# Patient Record
Sex: Female | Born: 1935
Health system: Southern US, Community
[De-identification: ages and names within clinical notes are randomized; demographics above are authoritative.]

## PROBLEM LIST (undated history)

## (undated) DIAGNOSIS — E785 Hyperlipidemia, unspecified: Secondary | ICD-10-CM

## (undated) DIAGNOSIS — N200 Calculus of kidney: Secondary | ICD-10-CM

## (undated) DIAGNOSIS — R7309 Other abnormal glucose: Secondary | ICD-10-CM

## (undated) DIAGNOSIS — E669 Obesity, unspecified: Secondary | ICD-10-CM

## (undated) DIAGNOSIS — M1990 Unspecified osteoarthritis, unspecified site: Secondary | ICD-10-CM

## (undated) DIAGNOSIS — I10 Essential (primary) hypertension: Secondary | ICD-10-CM

## (undated) HISTORY — DX: Other abnormal glucose: R73.09

## (undated) HISTORY — DX: Essential (primary) hypertension: I10

## (undated) HISTORY — DX: Obesity, unspecified: E66.9

## (undated) HISTORY — DX: Unspecified osteoarthritis, unspecified site: M19.90

## (undated) HISTORY — DX: Hyperlipidemia, unspecified: E78.5

## (undated) HISTORY — DX: Calculus of kidney: N20.0

## (undated) HISTORY — PX: TONSILLECTOMY: SHX5217

---

## 2001-08-15 ENCOUNTER — Ambulatory Visit (HOSPITAL_COMMUNITY): Admission: RE | Admit: 2001-08-15 | Discharge: 2001-08-15 | Payer: Self-pay | Admitting: Internal Medicine

## 2001-08-15 ENCOUNTER — Encounter: Payer: Self-pay | Admitting: Internal Medicine

## 2002-08-15 ENCOUNTER — Ambulatory Visit (HOSPITAL_COMMUNITY): Admission: RE | Admit: 2002-08-15 | Discharge: 2002-08-15 | Payer: Self-pay | Admitting: Internal Medicine

## 2002-08-15 ENCOUNTER — Encounter: Payer: Self-pay | Admitting: Internal Medicine

## 2003-08-17 ENCOUNTER — Ambulatory Visit (HOSPITAL_COMMUNITY): Admission: RE | Admit: 2003-08-17 | Discharge: 2003-08-17 | Payer: Self-pay | Admitting: Internal Medicine

## 2004-03-19 ENCOUNTER — Ambulatory Visit (HOSPITAL_COMMUNITY): Admission: RE | Admit: 2004-03-19 | Discharge: 2004-03-19 | Payer: Self-pay | Admitting: Internal Medicine

## 2004-03-25 ENCOUNTER — Ambulatory Visit (HOSPITAL_COMMUNITY): Admission: RE | Admit: 2004-03-25 | Discharge: 2004-03-25 | Payer: Self-pay | Admitting: Urology

## 2004-04-02 ENCOUNTER — Ambulatory Visit (HOSPITAL_COMMUNITY): Admission: RE | Admit: 2004-04-02 | Discharge: 2004-04-02 | Payer: Self-pay | Admitting: Urology

## 2004-05-05 ENCOUNTER — Ambulatory Visit (HOSPITAL_COMMUNITY): Admission: RE | Admit: 2004-05-05 | Discharge: 2004-05-05 | Payer: Self-pay | Admitting: Urology

## 2004-06-02 ENCOUNTER — Ambulatory Visit (HOSPITAL_COMMUNITY): Admission: RE | Admit: 2004-06-02 | Discharge: 2004-06-02 | Payer: Self-pay | Admitting: Urology

## 2004-06-18 ENCOUNTER — Ambulatory Visit (HOSPITAL_COMMUNITY): Admission: RE | Admit: 2004-06-18 | Discharge: 2004-06-18 | Payer: Self-pay | Admitting: Urology

## 2004-06-20 ENCOUNTER — Ambulatory Visit (HOSPITAL_COMMUNITY): Admission: RE | Admit: 2004-06-20 | Discharge: 2004-06-20 | Payer: Self-pay | Admitting: Urology

## 2004-07-21 ENCOUNTER — Ambulatory Visit (HOSPITAL_COMMUNITY): Admission: RE | Admit: 2004-07-21 | Discharge: 2004-07-21 | Payer: Self-pay | Admitting: Urology

## 2004-09-02 ENCOUNTER — Ambulatory Visit (HOSPITAL_COMMUNITY): Admission: RE | Admit: 2004-09-02 | Discharge: 2004-09-02 | Payer: Self-pay | Admitting: Internal Medicine

## 2004-09-22 ENCOUNTER — Ambulatory Visit (HOSPITAL_COMMUNITY): Admission: RE | Admit: 2004-09-22 | Discharge: 2004-09-22 | Payer: Self-pay | Admitting: Urology

## 2004-09-30 ENCOUNTER — Ambulatory Visit (HOSPITAL_COMMUNITY): Admission: RE | Admit: 2004-09-30 | Discharge: 2004-09-30 | Payer: Self-pay | Admitting: Urology

## 2004-10-19 LAB — CONVERTED CEMR LAB: Pap Smear: NORMAL

## 2004-10-29 ENCOUNTER — Ambulatory Visit (HOSPITAL_COMMUNITY): Admission: RE | Admit: 2004-10-29 | Discharge: 2004-10-29 | Payer: Self-pay | Admitting: Urology

## 2004-12-09 ENCOUNTER — Ambulatory Visit (HOSPITAL_COMMUNITY): Admission: RE | Admit: 2004-12-09 | Discharge: 2004-12-09 | Payer: Self-pay | Admitting: Urology

## 2005-03-04 ENCOUNTER — Ambulatory Visit (HOSPITAL_COMMUNITY): Admission: RE | Admit: 2005-03-04 | Discharge: 2005-03-04 | Payer: Self-pay | Admitting: Urology

## 2005-05-26 ENCOUNTER — Encounter (INDEPENDENT_AMBULATORY_CARE_PROVIDER_SITE_OTHER): Payer: Self-pay | Admitting: General Surgery

## 2005-05-26 ENCOUNTER — Ambulatory Visit (HOSPITAL_COMMUNITY): Admission: RE | Admit: 2005-05-26 | Discharge: 2005-05-26 | Payer: Self-pay | Admitting: General Surgery

## 2005-07-12 IMAGING — CR DG ABDOMEN 1V
1 series · 1 of 1 positions shown · non-contrast
Comparison: none

CLINICAL DATA: Right-sided renal calculus.  Status-post lithotripsy. 
 FRONTAL VIEW ABDOMEN 
 Comparing 06/18/04.

[view not recorded]
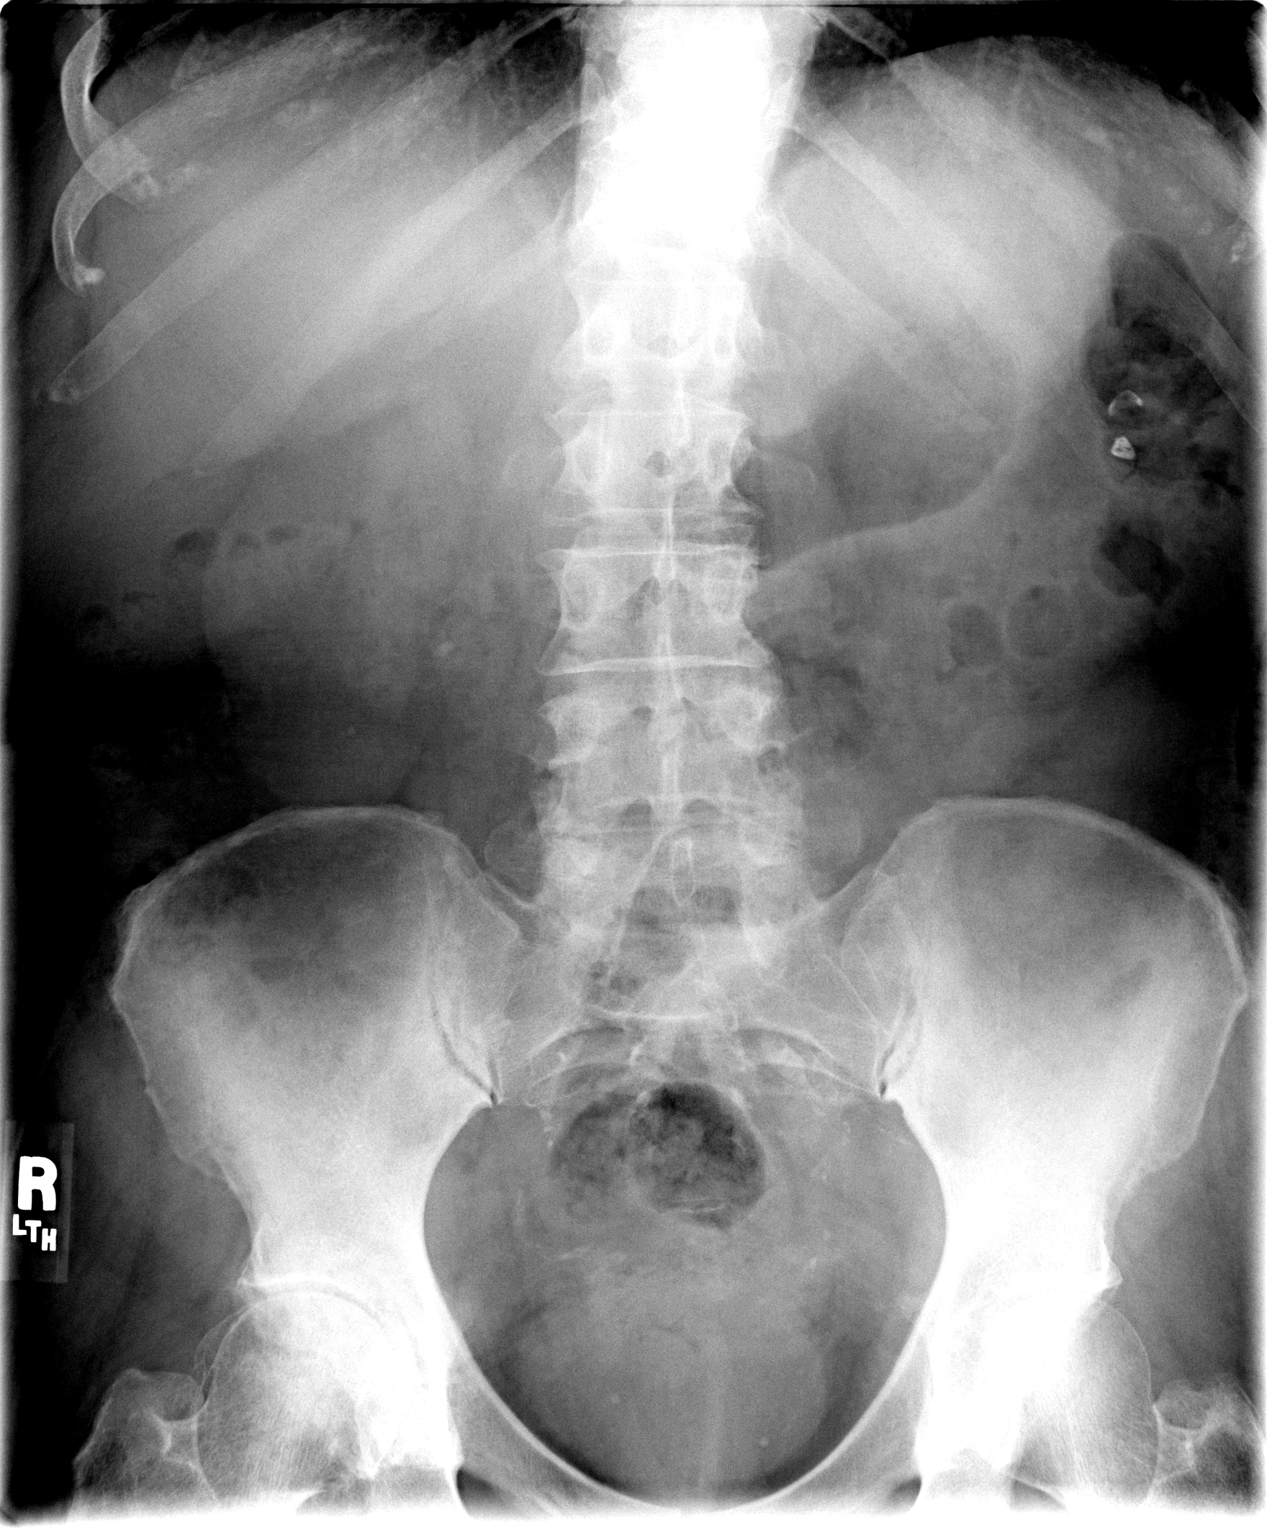

[1 of 1 positions shown; findings below may reference images not displayed]

FINDINGS: Currently we demonstrate a 6.2 x 3.4 mm calculus projecting over the expected location of the right collecting system.  There is a 2 mm calculus projecting over the right kidney lower pole.  Linear calcification in the right anatomic pelvis is stable from the prior exam and thus is felt to represent vascular calcification rather than distal ureteral calculi.  Pelvic phleboliths are also noted.  There is advanced degenerative arthropathy of the right hip.
 IMPRESSION
 1.  Right collecting system calculus and right lower pole caliceal calculus. 
 2  Advanced degenerative arthropathy of the right hip.

## 2005-09-15 ENCOUNTER — Ambulatory Visit (HOSPITAL_COMMUNITY): Admission: RE | Admit: 2005-09-15 | Discharge: 2005-09-15 | Payer: Self-pay | Admitting: Family Medicine

## 2006-09-23 ENCOUNTER — Ambulatory Visit (HOSPITAL_COMMUNITY): Admission: RE | Admit: 2006-09-23 | Discharge: 2006-09-23 | Payer: Self-pay | Admitting: Family Medicine

## 2006-09-30 ENCOUNTER — Encounter: Admission: RE | Admit: 2006-09-30 | Discharge: 2006-09-30 | Payer: Self-pay | Admitting: Family Medicine

## 2007-10-14 ENCOUNTER — Encounter: Admission: RE | Admit: 2007-10-14 | Discharge: 2007-10-14 | Payer: Self-pay | Admitting: Family Medicine

## 2008-04-11 ENCOUNTER — Ambulatory Visit (HOSPITAL_COMMUNITY): Admission: RE | Admit: 2008-04-11 | Discharge: 2008-04-11 | Payer: Self-pay | Admitting: Urology

## 2008-04-18 ENCOUNTER — Ambulatory Visit (HOSPITAL_COMMUNITY): Admission: RE | Admit: 2008-04-18 | Discharge: 2008-04-18 | Payer: Self-pay | Admitting: Urology

## 2008-04-23 ENCOUNTER — Ambulatory Visit (HOSPITAL_COMMUNITY): Admission: RE | Admit: 2008-04-23 | Discharge: 2008-04-23 | Payer: Self-pay | Admitting: Urology

## 2008-10-15 ENCOUNTER — Encounter: Payer: Self-pay | Admitting: Family Medicine

## 2008-10-15 ENCOUNTER — Encounter: Admission: RE | Admit: 2008-10-15 | Discharge: 2008-10-15 | Payer: Self-pay | Admitting: Family Medicine

## 2008-11-29 ENCOUNTER — Encounter: Payer: Self-pay | Admitting: Family Medicine

## 2008-12-05 ENCOUNTER — Ambulatory Visit (HOSPITAL_COMMUNITY): Admission: RE | Admit: 2008-12-05 | Discharge: 2008-12-05 | Payer: Self-pay | Admitting: Urology

## 2009-03-08 DIAGNOSIS — E785 Hyperlipidemia, unspecified: Secondary | ICD-10-CM

## 2009-03-08 DIAGNOSIS — I10 Essential (primary) hypertension: Secondary | ICD-10-CM

## 2009-03-08 HISTORY — DX: Essential (primary) hypertension: I10

## 2009-03-08 HISTORY — DX: Hyperlipidemia, unspecified: E78.5

## 2009-05-10 IMAGING — CR DG ABDOMEN 1V
1 series · 1 of 1 positions shown · non-contrast
Comparison: Correlation made with CT abdomen and pelvis 04/11/2008

CLINICAL DATA: Right renal calculus, pre lithotripsy

ABDOMEN - 1 VIEW

[view not recorded]
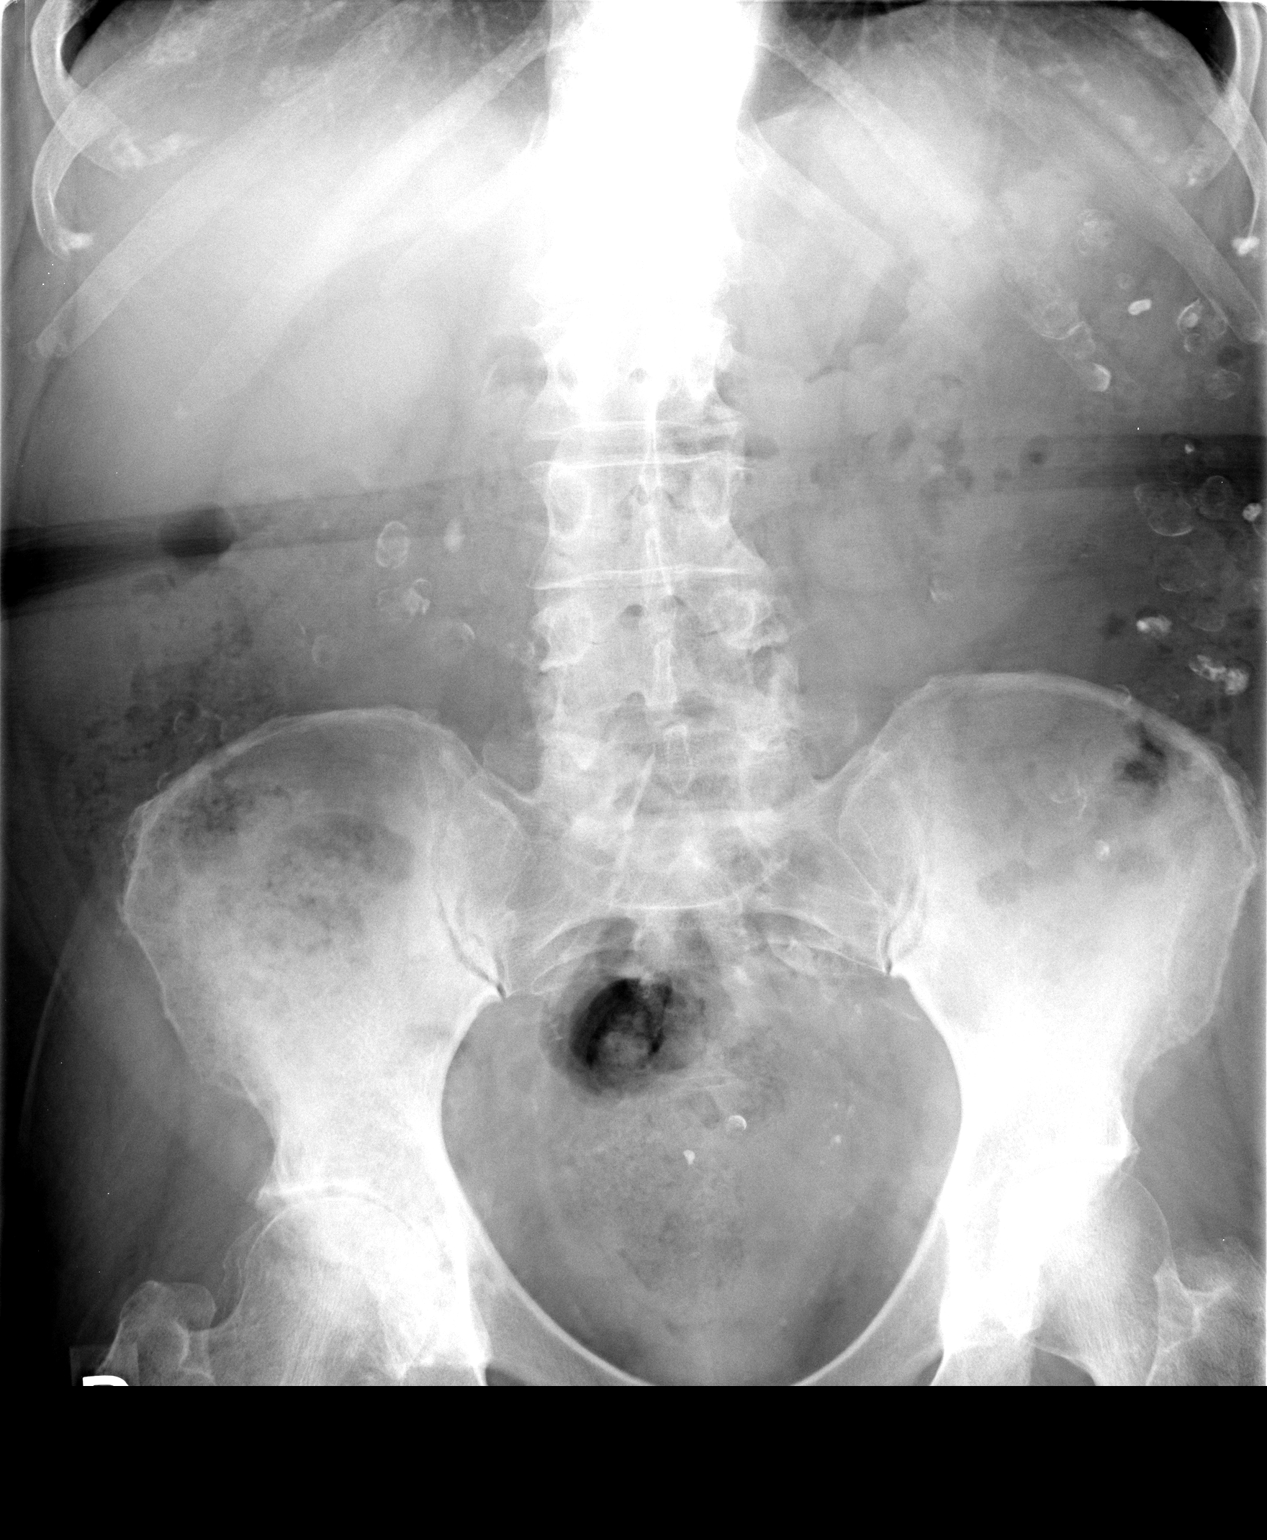

[1 of 1 positions shown; findings below may reference images not displayed]

FINDINGS: Calculus at right renal pelvis, 9 x 6 mm.
Tiny calcification in mid right kidney, 2 mm diameter.
Question tiny left renal calculi at mid and lower pole regions,
largest 4 mm down mid kidney.
Bowel gas pattern normal.
High attenuation residual contrast within multiple colonic
diverticula of transverse and descending colon.
Facet degenerative changes of lower lumbar spine.
Osteoarthritic changes right hip.
Bony demineralization.
IMPRESSION: Bilateral renal calculi with 9 x 6 mm diameter calculus at right
renal pelvis.
Colonic diverticulosis.

## 2009-05-23 ENCOUNTER — Ambulatory Visit: Payer: Self-pay | Admitting: Family Medicine

## 2009-05-23 LAB — CONVERTED CEMR LAB
AST: 22 units/L (ref 0–37)
Albumin: 3.9 g/dL (ref 3.5–5.2)
Alkaline Phosphatase: 91 units/L (ref 39–117)
CO2: 30 meq/L (ref 19–32)
Calcium: 10.3 mg/dL (ref 8.4–10.5)
Creatinine, Ser: 0.8 mg/dL (ref 0.4–1.2)
Glucose, Bld: 131 mg/dL — ABNORMAL HIGH (ref 70–99)
Total Bilirubin: 0.9 mg/dL (ref 0.3–1.2)
Total CHOL/HDL Ratio: 5
Triglycerides: 86 mg/dL (ref 0.0–149.0)

## 2009-07-04 ENCOUNTER — Telehealth: Payer: Self-pay | Admitting: Family Medicine

## 2009-08-22 ENCOUNTER — Ambulatory Visit: Payer: Self-pay | Admitting: Family Medicine

## 2009-08-22 ENCOUNTER — Telehealth: Payer: Self-pay | Admitting: Family Medicine

## 2009-08-22 LAB — CONVERTED CEMR LAB
AST: 25 units/L (ref 0–37)
Alkaline Phosphatase: 87 units/L (ref 39–117)
Bilirubin, Direct: 0 mg/dL (ref 0.0–0.3)
CO2: 30 meq/L (ref 19–32)
Calcium: 9.6 mg/dL (ref 8.4–10.5)
Creatinine, Ser: 0.9 mg/dL (ref 0.4–1.2)
Glucose, Bld: 119 mg/dL — ABNORMAL HIGH (ref 70–99)
Total Bilirubin: 0.9 mg/dL (ref 0.3–1.2)
Total CHOL/HDL Ratio: 4

## 2009-08-23 ENCOUNTER — Ambulatory Visit: Payer: Self-pay | Admitting: Family Medicine

## 2009-08-23 DIAGNOSIS — R7309 Other abnormal glucose: Secondary | ICD-10-CM

## 2009-08-23 HISTORY — DX: Other abnormal glucose: R73.09

## 2009-09-02 ENCOUNTER — Telehealth: Payer: Self-pay | Admitting: Family Medicine

## 2009-10-17 ENCOUNTER — Encounter: Admission: RE | Admit: 2009-10-17 | Discharge: 2009-10-17 | Payer: Self-pay | Admitting: Family Medicine

## 2009-11-21 ENCOUNTER — Ambulatory Visit: Payer: Self-pay | Admitting: Family Medicine

## 2009-12-27 IMAGING — CR DG ABDOMEN 1V
1 series · 1 of 1 positions shown · non-contrast
Comparison: 04/23/1978.

CLINICAL DATA: Bilateral renal calculi.

ABDOMEN - 1 VIEW

[view not recorded]
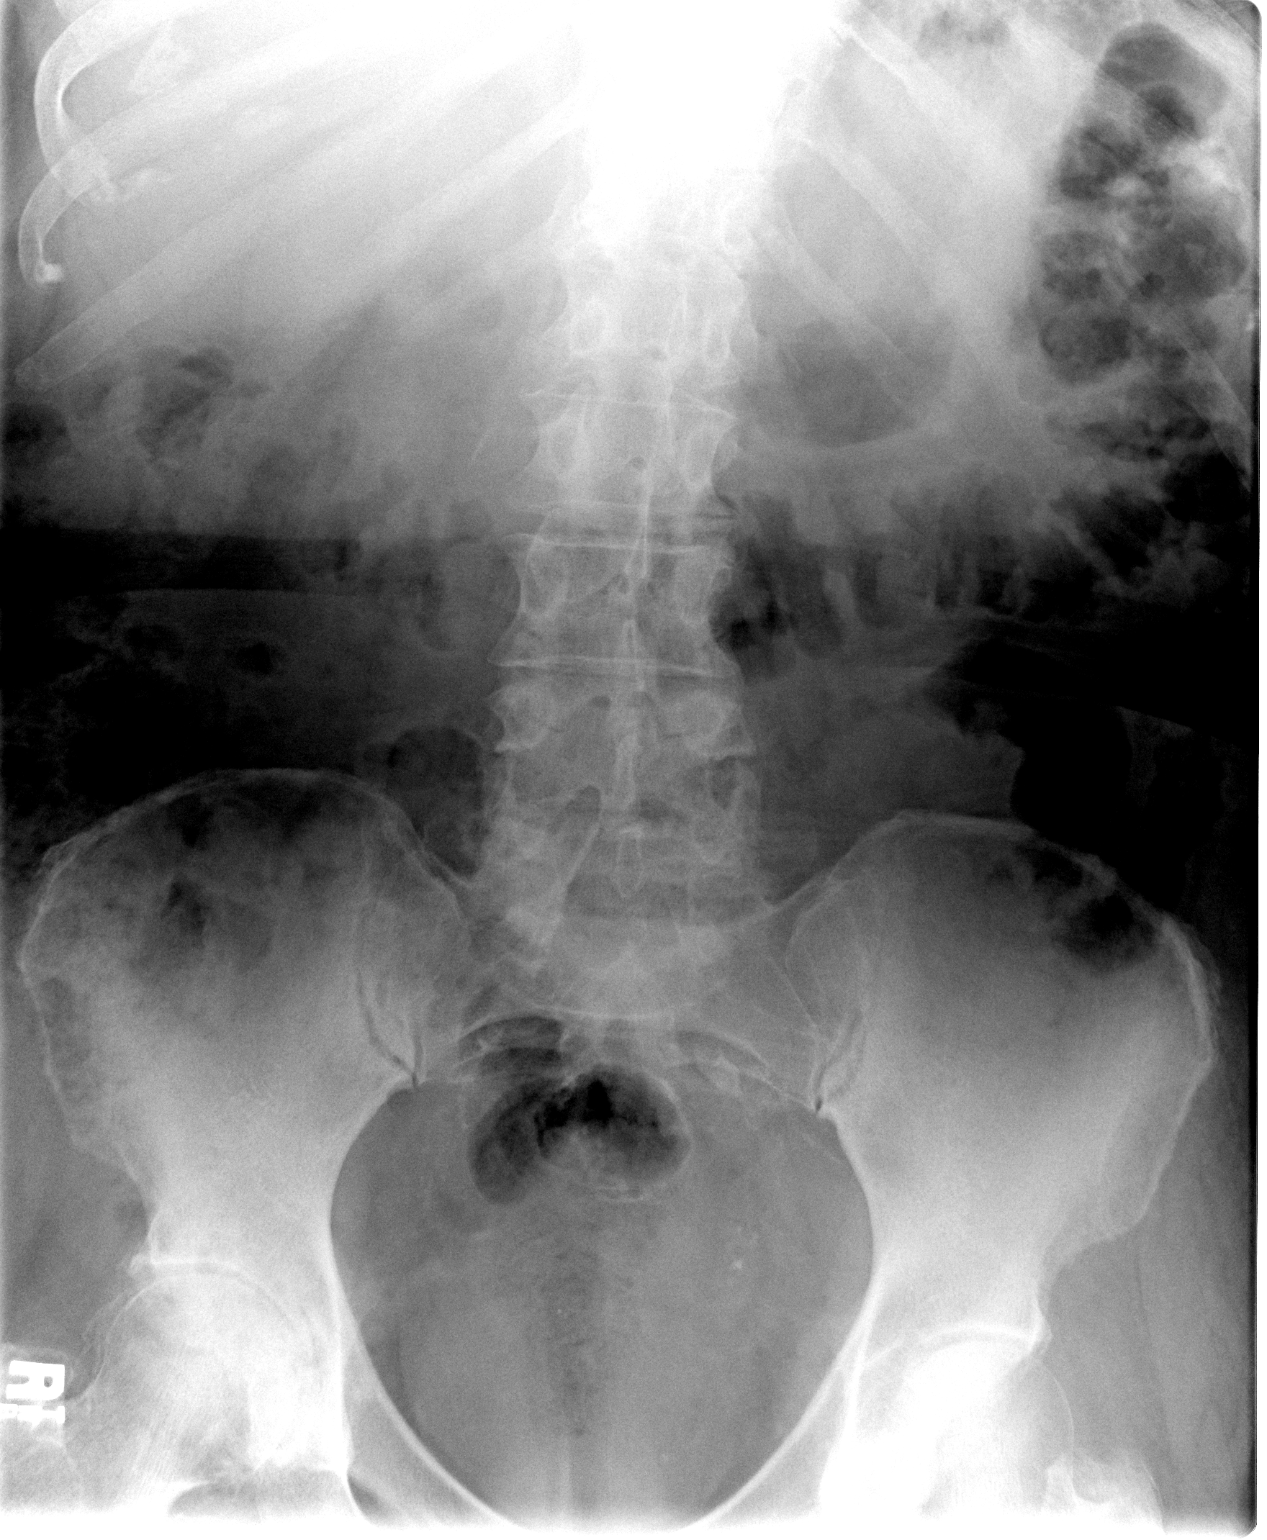

[1 of 1 positions shown; findings below may reference images not displayed]

FINDINGS: Negative for renal calculi.  Small calcifications in the
left pelvis appear unchanged and  are likely vascular
calcifications.  Bowel gas pattern is normal.  There is lumbar
scoliosis.  There is moderate to advanced degenerative change in
the right hip and mild degenerative change in the left hip.
IMPRESSION: No renal calculi are identified.  CT scanning may be of further
help if the patient has renal colic.

## 2010-01-07 ENCOUNTER — Ambulatory Visit (HOSPITAL_COMMUNITY): Admission: RE | Admit: 2010-01-07 | Discharge: 2010-01-07 | Payer: Self-pay | Admitting: Urology

## 2010-05-22 ENCOUNTER — Ambulatory Visit: Payer: Self-pay | Admitting: Family Medicine

## 2010-05-22 LAB — CONVERTED CEMR LAB: LDL Goal: 130 mg/dL

## 2010-06-26 ENCOUNTER — Ambulatory Visit (HOSPITAL_COMMUNITY): Admission: RE | Admit: 2010-06-26 | Discharge: 2010-06-26 | Payer: Self-pay | Admitting: Urology

## 2010-07-22 ENCOUNTER — Encounter: Payer: Self-pay | Admitting: Family Medicine

## 2010-07-22 ENCOUNTER — Ambulatory Visit (HOSPITAL_COMMUNITY): Admission: RE | Admit: 2010-07-22 | Discharge: 2010-07-22 | Payer: Self-pay | Admitting: General Surgery

## 2010-08-25 ENCOUNTER — Ambulatory Visit: Payer: Self-pay | Admitting: Family Medicine

## 2010-08-25 DIAGNOSIS — M1990 Unspecified osteoarthritis, unspecified site: Secondary | ICD-10-CM

## 2010-08-25 HISTORY — DX: Unspecified osteoarthritis, unspecified site: M19.90

## 2010-08-27 LAB — CONVERTED CEMR LAB
ALT: 18 U/L (ref 0–35)
AST: 23 U/L (ref 0–37)
Albumin: 4 g/dL (ref 3.5–5.2)
Alkaline Phosphatase: 91 U/L (ref 39–117)
BUN: 11 mg/dL (ref 6–23)
Bilirubin, Direct: 0.2 mg/dL (ref 0.0–0.3)
CO2: 29 meq/L (ref 19–32)
Calcium: 10.5 mg/dL (ref 8.4–10.5)
Chloride: 101 meq/L (ref 96–112)
Cholesterol: 176 mg/dL (ref 0–200)
Creatinine, Ser: 0.7 mg/dL (ref 0.4–1.2)
GFR calc non Af Amer: 103.83 mL/min (ref >60)
Glucose, Bld: 107 mg/dL — ABNORMAL HIGH (ref 70–99)
HDL: 52.4 mg/dL (ref >39.00)
Hgb A1c MFr Bld: 7.2 % — ABNORMAL HIGH (ref 4.6–6.5)
LDL Cholesterol: 108 mg/dL — ABNORMAL HIGH (ref 0–99)
Potassium: 4.8 meq/L (ref 3.5–5.1)
Sodium: 140 meq/L (ref 135–145)
Total Bilirubin: 0.9 mg/dL (ref 0.3–1.2)
Total CHOL/HDL Ratio: 3
Total Protein: 7.6 g/dL (ref 6.0–8.3)
Triglycerides: 78 mg/dL (ref 0.0–149.0)
VLDL: 15.6 mg/dL (ref 0.0–40.0)

## 2010-10-22 ENCOUNTER — Encounter
Admission: RE | Admit: 2010-10-22 | Discharge: 2010-10-22 | Payer: Self-pay | Source: Home / Self Care | Attending: Family Medicine | Admitting: Family Medicine

## 2010-10-29 ENCOUNTER — Ambulatory Visit
Admission: RE | Admit: 2010-10-29 | Discharge: 2010-10-29 | Payer: Self-pay | Source: Home / Self Care | Attending: Family Medicine | Admitting: Family Medicine

## 2010-10-29 ENCOUNTER — Other Ambulatory Visit: Payer: Self-pay | Admitting: Family Medicine

## 2010-10-29 LAB — HEMOGLOBIN A1C: Hgb A1c MFr Bld: 7.1 % — ABNORMAL HIGH (ref 4.6–6.5)

## 2010-11-09 ENCOUNTER — Encounter: Payer: Self-pay | Admitting: Family Medicine

## 2010-11-18 NOTE — Assessment & Plan Note (Signed)
Summary: follow-up/nn   Vital Signs:  Patient profile:   75 year old female Weight:      196 pounds Temp:     98.9 degrees F oral BP sitting:   120 / 78  (left arm) Cuff size:   large  Vitals Entered By: Sid Falcon LPN (November 21, 2009 4:17 PM) CC: 3 month follow-up, Hypertension Management CBG Result 112   History of Present Illness: Followup hypertension and prediabetes. Blood pressures have been stable. No symptoms of urine frequency or thirst. Recent fasting blood sugar 119. Has continued with some mild weight loss.  Relatively compliant with diet.  Hypertension History:      She denies headache, chest pain, palpitations, dyspnea with exertion, orthopnea, peripheral edema, visual symptoms, neurologic problems, syncope, and side effects from treatment.        Positive major cardiovascular risk factors include female age 62 years old or older, hyperlipidemia, and hypertension.  Negative major cardiovascular risk factors include non-tobacco-user status.     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    Physical Exam  General:  Well-developed,well-nourished,in no acute distress; alert,appropriate and cooperative throughout examination Mouth:  Oral mucosa and oropharynx without lesions or exudates.  Teeth in good repair. Neck:  No deformities, masses, or tenderness noted. Lungs:  Normal respiratory effort, chest  expands symmetrically. Lungs are clear to auscultation, no crackles or wheezes. Heart:  normal rate and regular rhythm.  2 3/6 systolic murmur right upper external border Extremities:  no edema   Impression & Recommendations:  Problem # 1:  PREDIABETES (ICD-790.29)  5 hr pp 112 today.  Orders: Capillary Blood Glucose/CBG (32355)  Problem # 2:  HYPERTENSION (ICD-401.9) refill HCTZ. Recent electrolytes normal Her updated medication list for this problem includes:    Hydrochlorothiazide 25 Mg Tabs (Hydrochlorothiazide) .Marland Kitchen... 1/2 tab daily  Problem # 3:  HYPERLIPIDEMIA (ICD-272.4)  Her updated medication list for this problem includes:    Simvastatin 40 Mg Tabs (Simvastatin) ..... Once daily  Complete Medication List: 1)  Simvastatin 40 Mg Tabs (Simvastatin) .... Once daily 2)  Adult Aspirin Ec Low Strength 81 Mg Tbec (Aspirin) .... Qd 3)  Hydrochlorothiazide 25 Mg Tabs (Hydrochlorothiazide) .... 1/2 tab daily  Hypertension Assessment/Plan:      The patient's hypertensive risk group is category B: At least one risk factor (excluding diabetes) with no target organ damage.  Her calculated 10 year risk of coronary heart disease is 9 %.  Today's blood pressure is 120/78.    Patient Instructions: 1)  Continue weight loss efforts. 2)  Please schedule a follow-up appointment in 6 months .  3)  your blood sugar today is 112 which is improved especially considering that this is not fasting. Prescriptions: HYDROCHLOROTHIAZIDE 25 MG TABS (HYDROCHLOROTHIAZIDE) 1/2 tab daily  #45 Tablet x 3   Entered and Authorized by:   Evelena Peat MD   Signed by:   Evelena Peat MD on 11/21/2009   Method used:   Electronically to        [facility] [address] [phone]* (  retail)       [address]       [place], [place]  [address]       Ph: [phone]       Fax: [phone]   RxID:   [id number]

## 2010-11-18 NOTE — Assessment & Plan Note (Signed)
Summary: 6 MONTH FUP//CCM   Vital Signs:  Patient profile:   75 year old female Weight:      195 pounds Temp:     98.5 degrees F oral BP sitting:   130 / 80  (left arm) Cuff size:   large  Vitals Entered By: Sid Falcon LPN (May 22, 2010 11:04 AM) CC: 6 month follow-up, Hypertension Management, Lipid Management CBG Result 134   History of Present Illness: Colonoscopy 2006 with recommended 3 year f/u.  She needs this scheduled with Dr Franky Macho. She just recently came across letter recommending follow up which would have been 2009.  Prediabetes and has lost 1 pd since last visit.  Fairly compliant with diet.  Hypertension stable.  Compliant with meds.  Hypertension History:      She denies headache, chest pain, palpitations, dyspnea with exertion, orthopnea, peripheral edema, visual symptoms, neurologic problems, syncope, and side effects from treatment.  She notes no problems with any antihypertensive medication side effects.        Positive major cardiovascular risk factors include female age 24 years old or older, hyperlipidemia, and hypertension.  Negative major cardiovascular risk factors include no history of diabetes and non-tobacco-user status.        Further assessment for target organ damage reveals no history of ASHD, stroke/TIA, or peripheral vascular disease.    Lipid Management History:      Positive NCEP/ATP III risk factors include female age 74 years old or older and hypertension.  Negative NCEP/ATP III risk factors include no history of early menopause without estrogen hormone replacement, non-diabetic, non-tobacco-user status, no ASHD (atherosclerotic heart disease), no prior stroke/TIA, no peripheral vascular disease, and no history of aortic aneurysm.      Clinical Review Panels:  Prevention   Last Mammogram:  ASSESSMENT: Negative - BI-RADS 1^MM DIGITAL SCREENING (10/17/2009)   Last Pap Smear:  normal (10/19/2004)   Last Colonoscopy:  normal  (05/19/2005)  Immunizations   Last Flu Vaccine:  Fluvax 3+ (08/23/2009)   Last Pneumovax:  Historical (10/20/2003)   Allergies (verified): No Known Drug Allergies  Past History:  Past Surgical History: Last updated: 05/23/2009 Tonsillectomy  1948  Family History: Last updated: 03/20/2009 Father, diabetes Mother died of multiple myeloma complications at 17  Social History: Last updated: 05/23/2009 Retired  Engineer, site Psychologist, 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    Physical Exam  General:  Well-developed,well-nourished,in no acute distress; alert,appropriate and cooperative throughout examination Ears:  minimal cerumen both canals Mouth:  Oral mucosa and oropharynx without lesions or exudates.  Teeth in good repair. Neck:  No deformities, masses, or tenderness noted. Lungs:  Normal respiratory effort, chest expands symmetrically. Lungs are clear to auscultation, no crackles or wheezes. Heart:  normal rate and regular rhythm.   Extremities:  No clubbing, cyanosis, edema, or deformity noted with normal full range of motion of all joints.   Neurologic:  alert & oriented X3, cranial nerves II-XII intact, and gait normal.   Psych:  normally interactive, good eye contact, not anxious appearing, and not depressed appearing.     Impression & Recommendations:  Problem # 1:  PREDIABETES (ICD-790.29) CBG today  134.  Needs to lose more weight and rec f/u fasting CBG and A1C in 3 months.  Problem # 2:   HYPERTENSION (ICD-401.9)  Her updated medication list for this problem includes:    Hydrochlorothiazide 25 Mg Tabs (Hydrochlorothiazide) .Marland Kitchen... 1/2 tab daily  Orders: Prescription Created Electronically [phone])  Problem # 3:  HYPERLIPIDEMIA (ICD-272.4)  Her updated medication list for this problem includes:    Simvastatin 40 Mg Tabs (Simvastatin) ..... Once daily  Complete Medication List: 1)  Simvastatin 40 Mg Tabs (Simvastatin) .... Once daily 2)  Adult Aspirin Ec Low Strength 81 Mg Tbec (Aspirin) .... Qd 3)  Hydrochlorothiazide 25 Mg Tabs (Hydrochlorothiazide) .... 1/2 tab daily 4)  Centrum Silver Ultra Womens Tabs (Multiple vitamins-minerals) .... Once daily 5)  Fish Oil 1000 Mg Caps (Omega-3 fatty acids) .... Once daily  Other Orders: Capillary Blood Glucose/CBG (28315)  Hypertension Assessment/Plan:      The patient's hypertensive risk group is category B: At least one risk factor (excluding diabetes) with no target organ damage.  Her calculated 10 year risk of coronary heart disease is 9 %.  Today's blood pressure is 130/80.    Lipid Assessment/Plan:      Based on NCEP/ATP III, the patient's risk factor category is "0-1 risk factors".  The patient's lipid goals are as follows: Total cholesterol goal is 200; LDL cholesterol goal is 130; HDL cholesterol goal is 40; Triglyceride goal is 150.    Patient Instructions: 1)  Please schedule a follow-up appointment in 6 months .  2)  It is important that you exercise reguarly at least 20 minutes 5 times a week. If you develop chest pain, have severe difficulty breathing, or feel very tired, stop exercising immediately and seek medical attention.  3)  You need to lose weight. Consider a lower calorie diet and regular exercise.  4)  Check your blood sugars regularly. If your readings are usually above:140 fasting  or below 70 you should contact our office.  Prescriptions: HYDROCHLOROTHIAZIDE 25 MG TABS (HYDROCHLOROTHIAZIDE) 1/2 tab  daily  #45 Tablet x 3   Entered and Authorized by:   Evelena Peat MD   Signed by:   Evelena Peat MD on 05/22/2010   Method used:   Electronically to        [facility] [id number]* (retail)       [address] [place], [place]  [address]       Ph: [phone]       Fax: [phone]   RxID:   [id number]

## 2010-11-18 NOTE — Assessment & Plan Note (Signed)
Summary: 3 MONTH F/U//ALP   Vital Signs:  Patient profile:   75 year old female Weight:      188 pounds Temp:     97.8 degrees F oral BP sitting:   142 / 80  (left arm) Cuff size:   regular  Vitals Entered By: Sid Falcon LPN (August 25, 2010 11:20 AM)  History of Present Illness: Here for medical follow up.  Some weight loss since last visit which she attributes to some recent dental problems.  Eating more soups and liquid diet until recently.  Hx prediabetes.  No symptoms of hyperglycemia.  Hyperlipidemia and needs follow up labs.  No med side effects and compliant with therapy.  Osteoarthritis, esp L knee.  Poor relief with Tylenol  and Nsaids.  Hypertension History:      She denies headache, chest pain, palpitations, dyspnea with exertion, orthopnea, PND, peripheral edema, visual symptoms, neurologic problems, syncope, and side effects from treatment.        Positive major cardiovascular risk factors include female age 61 years old or older, hyperlipidemia, and hypertension.  Negative major cardiovascular risk factors include no history of diabetes and non-tobacco-user status.        Further assessment for target organ damage reveals no history of ASHD, stroke/TIA, or peripheral vascular disease.    Lipid Management History:      Positive NCEP/ATP III risk factors include female age 34 years old or older and hypertension.  Negative NCEP/ATP III risk factors include no history of early menopause without estrogen hormone replacement, non-diabetic, non-tobacco-user status, no ASHD (atherosclerotic heart disease), no prior stroke/TIA, no peripheral vascular disease, and no history of aortic aneurysm.      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    Physical Exam  General:  Well-developed,well-nourished,in no acute distress; alert,appropriate and cooperative throughout examination Ears:  External ear exam shows no significant lesions or deformities.  Otoscopic examination reveals clear canals, tympanic membranes are intact bilaterally without bulging, retraction, inflammation or discharge. Hearing is grossly normal bilaterally. Mouth:  Oral mucosa and oropharynx without lesions or exudates.  Teeth in good repair. Neck:  No deformities, masses, or tenderness noted. Lungs:  Normal respiratory effort, chest expands symmetrically. Lungs are clear to auscultation, no crackles or wheezes. Heart:  normal rate and regular rhythm.   Extremities:  no edema.  L knee small effusion with no warmth.  Good ROM.  No localized tenderness. Neurologic:  alert & oriented X3 and cranial nerves II-XII intact.     Impression & Recommendations:  Problem # 1:  PREDIABETES (ICD-790.29) repeat A1C and fasting glucose. Orders: Venipuncture ([address]) Specimen Handling (60454) TLB-A1C / Hgb A1C (Glycohemoglobin) (83036-A1C)  Problem # 2:  HYPERLIPIDEMIA (ICD-272.4) needs labs. Her updated medication list for this problem includes:    Simvastatin 40 Mg Tabs (Simvastatin) ..... Once daily  Orders: TLB-Lipid Panel (80061-LIPID) Venipuncture (09811) Specimen Handling (91478) TLB-Hepatic/Liver Function  Pnl  (80076-HEPATIC)  Problem # 3:  HYPERTENSION (ICD-401.9)  Her updated medication list for this problem includes:    Hydrochlorothiazide 25 Mg Tabs (Hydrochlorothiazide) .Marland Kitchen... 1/2 tab daily  Orders: Venipuncture ([address]644) Specimen Handling (03474) TLB-BMP (Basic Metabolic Panel-BMET) (80048-METABOL)  Problem # 4:  DEGENERATIVE JOINT DISEASE (ICD-715.90) Assessment: Deteriorated trial of Ultram. Her updated medication list for this problem includes:    Adult Aspirin Ec Low Strength 81 Mg Tbec (Aspirin) ..... Qd    Ultram 50 Mg Tabs (Tramadol hcl) .Marland Kitchen... 1-2 by mouth q 6 hours as needed  Complete Medication List: 1)  Simvastatin 40 Mg Tabs (Simvastatin) .... Once daily 2)  Adult Aspirin Ec Low Strength 81 Mg Tbec (Aspirin) .... Qd 3)  Hydrochlorothiazide 25 Mg Tabs (Hydrochlorothiazide) .... 1/2 tab daily 4)  Centrum Silver Ultra Womens Tabs (Multiple vitamins-minerals) .... Once daily 5)  Fish Oil Triple Strength 1400 Mg Caps (Omega-3 fatty acids) .... Once daily 6)  Ultram 50 Mg Tabs (Tramadol hcl) .Marland Kitchen.. 1-2 by mouth q 6 hours as needed  Other Orders: Flu Vaccine 52yrs + MEDICARE PATIENTS (Q5956) Administration Flu vaccine - MCR (L8756)  Hypertension Assessment/Plan:      The patient's hypertensive risk group is category B: At least one risk factor (excluding diabetes) with no target organ damage.  Her calculated 10 year risk of coronary heart disease is 13 %.  Today's blood pressure is 142/80.    Lipid Assessment/Plan:      Based on NCEP/ATP III, the patient's risk factor category is "2 or more risk factors and a calculated 10 year CAD risk of < 20%".  The patient's lipid goals are as follows: Total cholesterol goal is 200; LDL cholesterol goal is 130; HDL cholesterol goal is 40; Triglyceride goal is 150.    Patient Instructions: 1)  Please schedule a follow-up appointment in 6 months .  Prescriptions: ULTRAM 50 MG TABS (TRAMADOL HCL) 1-2 by mouth q 6 hours as needed  #60 x 3    Entered and Authorized by:   Evelena Peat MD   Signed by:   Evelena Peat MD on 08/25/2010   Method used:   Electronically to        [facility] [id number]* (retail)       [address] [place], [place]  [address]       Ph: [phone]       Fax: [phone]   RxID:   [id number]    Orders Added: 1)  Flu Vaccine 73yrs + MEDICARE PATIENTS [Q2039] 2)  Administration Flu vaccine - MCR [G0008] 3)  TLB-Lipid Panel [80061-LIPID] 4)  Venipuncture [37628] 5)  Specimen Handling [99000] 6)  TLB-Hepatic/Liver Function Pnl [80076-HEPATIC] 7)  TLB-BMP (Basic Metabolic Panel-BMET) [80048-METABOL] 8)  TLB-A1C / Hgb A1C (Glycohemoglobin) [83036-A1C] 9)  Est. Patient Level IV [31517]  Flu Vaccine Consent Questions     Do you have a history of severe allergic reactions to this vaccine? no    Any prior history of allergic reactions to egg and/or gelatin? no    Do you have a sensitivity to the preservative Thimersol? no    Do you have a past history of Guillan-Barre Syndrome? no    Do you currently have an acute febrile illness? no    Have you ever had a severe reaction to latex? no    Vaccine information given and explained to patient? yes  Are you currently pregnant? no    Lot Number:[id number]   Exp Date:04/18/2011   Site Given  Left Deltoid IM        .[email or web address]

## 2010-11-18 NOTE — Procedures (Signed)
Summary: Colonoscopy Report/[facility] [place]  Colonoscopy [facility]   Imported By: Maryln Gottron 08/07/2010 14:29:14  _____________________________________________________________________  External Attachment:    Type:   Image     Comment:   External Document

## 2010-12-02 ENCOUNTER — Other Ambulatory Visit: Payer: Self-pay | Admitting: *Deleted

## 2010-12-02 DIAGNOSIS — E785 Hyperlipidemia, unspecified: Secondary | ICD-10-CM

## 2010-12-02 MED ORDER — SIMVASTATIN 40 MG PO TABS: 40.0000 mg | ORAL_TABLET | Freq: Every day | ORAL | Status: DC

## 2011-01-29 ENCOUNTER — Other Ambulatory Visit (INDEPENDENT_AMBULATORY_CARE_PROVIDER_SITE_OTHER): Payer: Medicare Other | Admitting: Family Medicine

## 2011-01-29 LAB — HEMOGLOBIN A1C: Hgb A1c MFr Bld: 7.2 % — ABNORMAL HIGH (ref 4.6–6.5)

## 2011-02-20 ENCOUNTER — Encounter: Payer: Self-pay | Admitting: Family Medicine

## 2011-02-24 ENCOUNTER — Encounter: Payer: Self-pay | Admitting: Family Medicine

## 2011-02-24 ENCOUNTER — Ambulatory Visit (INDEPENDENT_AMBULATORY_CARE_PROVIDER_SITE_OTHER): Payer: Medicare Other | Admitting: Family Medicine

## 2011-02-24 DIAGNOSIS — R7309 Other abnormal glucose: Secondary | ICD-10-CM

## 2011-02-24 DIAGNOSIS — I10 Essential (primary) hypertension: Secondary | ICD-10-CM

## 2011-02-24 DIAGNOSIS — E785 Hyperlipidemia, unspecified: Secondary | ICD-10-CM

## 2011-02-24 NOTE — Progress Notes (Signed)
  Subjective:    Patient ID: [name], female    DOB: 11-12-1935, 75 y.o.   MRN: [id number]  HPI Patient seen for medical followup. History of obesity, hyperlipidemia, hypertension, prediabetes/diabetes and degenerative joint disease. Recent A1c 7.2%. Has lost about 6 pounds since last office visit. Compliant with diet and walking for exercise. Feels better overall. Blood pressure stable by home readings. Lipids checked last November and stable. She has no new complaints today.  Medications reviewed and compliant with all.  She denies any side effects.  She does not have any hx of CAD or peripheral vascular disease.   Review of Systems  Constitutional: Negative for chills, activity change, appetite change, fatigue and unexpected weight change.  HENT: Negative for trouble swallowing.   Eyes: Negative for visual disturbance.  Respiratory: Negative for cough and shortness of breath.   Cardiovascular: Negative for chest pain, palpitations and leg swelling.  Gastrointestinal: Negative for abdominal pain and blood in stool.  Genitourinary: Negative for dysuria.  Neurological: Negative for dizziness, syncope and headaches.  Hematological: Negative for adenopathy. Does not bruise/bleed easily.  Psychiatric/Behavioral: Negative for confusion and dysphoric mood.       Objective:   Physical Exam  Constitutional: She is oriented to person, place, and time. She appears well-developed and well-nourished. No distress.  HENT:  Right Ear: External ear normal.  Left Ear: External ear normal.  Mouth/Throat: Oropharynx is clear and moist. No oropharyngeal exudate.  Neck: Neck supple.  Cardiovascular: Normal rate, regular rhythm and normal heart sounds.   Pulmonary/Chest: Effort normal and breath sounds normal. She has no wheezes. She has no rales.  Lymphadenopathy:    She has no cervical adenopathy.  Neurological: She is alert and oriented to person, place, and time. No cranial nerve deficit.    Skin: No rash noted.  Psychiatric: She has a normal mood and affect.          Assessment & Plan:  #1 hypertension stable. Continue current medications #2 type 2 diabetes adequately controlled. Continue weight loss efforts and reassess A1c in 6 months #3 hyperlipidemia stable at labs last fall. Continue to check once yearly.

## 2011-02-24 NOTE — Patient Instructions (Signed)
Continue weight loss efforts.

## 2011-03-03 NOTE — H&P (Signed)
[name], [name]                ACCOUNT NO.:  [id number]   MEDICAL RECORD NO.:  [id number]          PATIENT TYPE:  AMB   LOCATION:  DAY                           FACILITY:  [facility]   PHYSICIAN:  Dennie Maizes, M.D.   DATE OF BIRTH:  07-02-1936   DATE OF ADMISSION:  04/18/2008  DATE OF DISCHARGE:  LH                              HISTORY & PHYSICAL   CHIEF COMPLAINT:  Right lower quadrant abdominal pain, right flank pain.   HISTORY OF PRESENT ILLNESS:  This 75 year old female has a past history  of recurrent urolithiasis.  She has undergone ureteroscopy and stone  extraction, as well as extracorporeal shock wave lithotripsy of a right  renal calculus.  She has remained symptom free until about 4-5 months  ago.  She started having intermittent right lower quadrant abdominal  pain of moderate severity for the past 4-5 months.  Pain radiates to the  right flank occasionally.  She did not have any voiding difficulty,  gross hematuria, or dysuria.   Further evaluation was done with a CT scan of the abdomen and pelvis  with and without contrast.  This revealed bilateral small nonobstructive  renal calculi.  There was a 7 mm size stone in the right ureteropelvic  junction, with mild hydronephrosis.  The patient is brought to short-  stay surgery today for extracorporeal shock wave lithotripsy of the  symptomatic right renal calculus.   PAST MEDICAL HISTORY:  1. History of recurrent urolithiasis, status post ureteroscopy and      stone extraction as well as ESWL.  2. History of hyperlipidemia.   MEDICATIONS:  Lipitor.   ALLERGIES:  None.   PHYSICAL EXAMINATION:  VITAL SIGNS:  Height 5 feet 4 inches weight 205  pounds.  HEENT:  Normal.  NECK:  No masses.  LUNGS: Clear to auscultation.  HEART:  Regular rate and rhythm.  No murmurs.  ABDOMEN:  Soft.  No palpable flank mass.  No costovertebral angle  tenderness.  Mild right lower quadrant abdominal tenderness is noted.  Bladder not  palpable.   IMPRESSION:  1. Right renal calculus, 7 x 7 mm in size.  2. Right flank pain.  3. Microhematuria   PLAN:  ESWL of the right renal calculus with IV sedation in the short-  stay center.  I have discussed with the patient regarding the diagnosis,  operative details, alternative treatments, outcome, possible risks, and  complications, and she has agreed for the procedure to be done.      Dennie Maizes, M.D.  Electronically Signed     SK/MEDQ  D:  04/18/2008  T:  04/18/2008  Job:  [id number]   cc:   [facility]  Fax: [phone]

## 2011-03-06 NOTE — H&P (Signed)
NAME:  [name], [name] NO.:  [id number]   MEDICAL RECORD NO.:  [id number]                   PATIENT TYPE:  AMB   LOCATION:  DAY                                  FACILITY:  [facility]   PHYSICIAN:  Dennie Maizes, M.D.                DATE OF BIRTH:  [birth year]   DATE OF ADMISSION:  06/18/2004  DATE OF DISCHARGE:                                HISTORY & PHYSICAL   CHIEF COMPLAINT:  Right flank pain, right renal calculus (7 x 4 mm).   HISTORY OF PRESENT ILLNESS:  This 75 year old female is referred to me by  Dr. Leona Carry. She experienced severe right lower quadrant abdominal pain  and occasional right flank pain for several weeks. She also had increased  belching and bloating. She was evaluated with a CT scan of the abdomen and  pelvis with contrast. This revealed right renal calculus, measuring 7 x 5 mm  in size. Mild right hydronephrosis and hydroureter caused by a 6 x 6 mm size  right distal ureteral calculus was also noted. The patient was unable to  pass the stone. She has undergone urethroscopy stone extraction on May 05, 2004. She still has a large right renal calculus. She is brought to the day  hospital today for extracorporeal shock wave lithotripsy of the right renal  calculus. She has urinary frequency x4 to 5, nocturia x0. She denied having  any fever, chills, voiding difficulty, hematuria __________.   PAST MEDICAL HISTORY:  Negative for diabetes mellitus, hypertension, or  heart disease. She has hyperlipidemia. Status post urethroscopy stone  extraction on May 05, 2004.   MEDICATIONS:  Lipitor 40 mg 1 p.o. q. daily, calcium with vitamin D  supplements, vitamin C, aspirin 81 mg p.o. q. daily which has been stopped  for the lithotripsy.   ALLERGIES:  No known drug allergies.   FAMILY HISTORY:  Positive for diabetes mellitus and multiple myeloma.   PHYSICAL EXAMINATION:  VITAL SIGNS:  Height 5 foot 4. Weight 205 pounds.  HEENT:  Normal.  LUNGS:  Clear to auscultation.  HEART:  Regular rate and rhythm. No murmurs.  ABDOMEN:  Soft. No palpable flank mass or costovertebral angle tenderness.  No suprapubic tenderness.  PELVIC:  Examination was not done.   IMPRESSION:  Right flank pain, right renal calculus.   PLAN:  ESWL of right renal calculus with IV sedation in day hospital. I have  discussed with the patient in detail regarding the diagnosis, operative  details, alternate treatments, outcome, possible risks and complications and  she has agreed for the procedure to be done.     ___________________________________________                                         Dennie Maizes, M.D.   SK/MEDQ  D:  06/17/2004  T:  06/17/2004  Job:  [id number]   cc:   Bernerd Limbo. Leona Carry, M.D.  [address]  [place]  [place] [address]  Fax: [phone]

## 2011-03-06 NOTE — H&P (Signed)
[name], [name] NO.:  [id number]   MEDICAL RECORD NO.:  [id number]                   PATIENT TYPE:   LOCATION:                                       FACILITY:   PHYSICIAN:  Dennie Maizes, M.D.                DATE OF BIRTH:   DATE OF ADMISSION:  05/05/2004  DATE OF DISCHARGE:                                HISTORY & PHYSICAL   CHIEF COMPLAINT:  Right flank and right lower quadrant abdominal pain, right  distal ureteral calculus with obstruction.   HISTORY OF PRESENT ILLNESS:  This 75 year old female is referred to me by  Dr. Leona Carry.  She experienced severe right lower quadrant abdominal pain  and occasional right flank pain for several weeks.  She also had increased  belching and bloating.  She was evaluated with a CT scan of abdomen and  pelvis with contrast.  This revealed right renal calculus measuring 7 x 5 mm  in size.  There was a cyst in the left kidney measuring 1 cm in size.  Mild  right hydronephrosis and hydroureter were noted caused by a 6 x 6 mm size  right distal ureter calculus proximal to the uterosacral junction.  The  patient has not passed the stone.  She has been having intermittent mild to  moderate pain.  She has urinary frequency x 4 to 5, nocturia x 0.  She also  has noticed a single episode of mild hematuria.  There is no past history of  ureterolithiasis.  She has not had any recurrent urinary tract infections.   PAST MEDICAL HISTORY:  Negative for diabetes mellitus, hypertension, or  heart disease.  She has hyperlipidemia.  She has not had any surgery in the  past.   MEDICATIONS:  1. Lipitor 40 mg 1 p.o. daily.  2. Calcium with vitamin D supplements.  3. Vitamin C.  4. Aspirin 81 mg 1 p.o. daily which has been stopped for the surgery.   ALLERGIES:  None.   FAMILY HISTORY:  Positive for diabetes mellitus and multiple myeloma.   PHYSICAL EXAMINATION:  VITAL SIGNS:  Height 5 feet 4 inches, weight 205   pounds.  HEAD, EYES, EARS, NOSE, AND THROAT:  Normal.  LUNGS:  Clear to auscultation.  HEART:  Regular rate and rhythm.  No murmurs.  ABDOMEN:  Soft.  No palpable flank mass or costovertebral angle tenderness.  No suprapubic tenderness.  PELVIC:  Examination was not done.   IMPRESSION:  1. Right distal ureteral calculus with obstruction.  2. Right renal colic.  3. Right hydronephrosis.  4. Nonobstructing right renal calculus.   PLAN:  Cystoscopy, right retrograde pyelogram, ureteroscopy, stone  extraction, and right ureteral stent placement under anesthesia in the  hospital.  I have discussed with the patient regarding the diagnosis,  operative details, outcome, possible risks and complications,  and she has  agreed for the procedure to  be done.  The right renal calculus will be  treated later with ESL as an outpatient.  I informed the patient regarding  the procedure and its complications.     ___________________________________________                                         Dennie Maizes, M.D.   SK/MEDQ  D:  05/04/2004  T:  05/04/2004  Job:  [id number]   cc:   Bernerd Limbo. Leona Carry, M.D.  [address]  [place]  [place] [address]  Fax: [phone]

## 2011-03-06 NOTE — H&P (Signed)
NAMEMARYAN, [name] NO.:  [id number]   MEDICAL RECORD NO.:  [id number]          PATIENT TYPE:  AMB   LOCATION:  DAY                           FACILITY:  [facility]   PHYSICIAN:  Dalia Heading, M.D.  DATE OF BIRTH:  05/22/1936   DATE OF ADMISSION:  DATE OF DISCHARGE:  LH                                HISTORY & PHYSICAL   CHIEF COMPLAINT:  Need for screening colonoscopy.   HISTORY OF PRESENT ILLNESS:  The patient is a 74 year old black female who  is referred for endoscopic evaluation.  She needs a colonoscopy for  screening purposes.  She denies any abdominal complaints.  She has never had  a colonoscopy.  There is no family history of colon carcinoma.   PAST MEDICAL HISTORY:  1.  High cholesterol levels.  2.  History of nephrolithiasis.   PAST SURGICAL HISTORY:  Unremarkable.   CURRENT MEDICATIONS:  Lipitor.   ALLERGIES:  No known drug allergies.   REVIEW OF SYSTEMS:  Noncontributory.   PHYSICAL EXAMINATION:  GENERAL APPEARANCE:  Well-developed, well-nourished  black female in no acute distress.  VITAL SIGNS:  Afebrile and vital signs are stable.  LUNGS:  Clear to auscultation with good breath sounds bilaterally.  HEART:  Regular rate and rhythm without S3, S4 or murmurs.  ABDOMEN:  Soft, nontender, nondistended.  No hepatosplenomegaly or masses  are noted.  RECTAL:  Deferred for the procedure.   IMPRESSION:  Need for screening colonoscopy.   PLAN:  The patient is scheduled for a colonoscopy on May 26, 2005.  Risks  and benefits of the procedure including bleeding and perforation were fully  explained to the patient.  Gave informed consent.       MAJ/MEDQ  D:  05/12/2005  T:  05/12/2005  Job:  [id number]   cc:   Bernerd Limbo. Leona Carry, M.D.  [address]  [place]  [place] [address]  Fax: [phone]

## 2011-03-06 NOTE — Op Note (Signed)
NAME:  [name], [name] NO.:  [id number]   MEDICAL RECORD NO.:  [id number]                   PATIENT TYPE:  AMB   LOCATION:  DAY                                  FACILITY:  [facility]   PHYSICIAN:  Dennie Maizes, M.D.                DATE OF BIRTH:  01/10/1936   DATE OF PROCEDURE:  05/05/2004  DATE OF DISCHARGE:                                 OPERATIVE REPORT   PREOPERATIVE DIAGNOSIS:  Right distal ureteral calculus with obstruction,  right renal colic, right hydronephrosis.   POSTOPERATIVE DIAGNOSIS:  Right distal ureteral calculus with obstruction,  right renal colic, right hydronephrosis.   PROCEDURE:  Cystoscopy, right retrograde pyelogram, right ureteroscopy with  stone extraction, and right ureteral stent placement.   ANESTHESIA:  General.   SURGEON:  Dennie Maizes, M.D.   COMPLICATIONS:  None.   ESTIMATED BLOOD LOSS:  Minimal.   DRAINS:  6 French 26-cm size right ureteral stent with a string.   SPECIMENS:  6-mm x 6-mm stone which was sent for chemical analysis.   INDICATIONS FOR PROCEDURE:  This 75 year old female was evaluated for  intermittent right lower quadrant abdominal pain and right flank pain.  She  had a  6 x 6-mm size right distal ureteral calculus with obstruction.  She  was unable to pass the stone.  She was taken to the operating room today for  cystoscopy, right retrograde pyelogram, ureteroscopy with stone extraction,  and right ureteral stent placement.   DESCRIPTION OF PROCEDURE:  General anesthesia was induced, and the patient  was placed on the OR table in the dorsal lithotomy position.  The lower  abdomen and genitalia were prepped and draped in a sterile fashion.   Cystoscopy was done with a 25 [place] scope.  The appearance of the bladder  was normal.  A 5 French wedge catheter was then placed in the right ureteral  orifice.  7 cc of Renografin-60 was injected into the collecting system, and  retrograde pyelogram  was done by using C-arm fluoroscopy.  There was a large  filling defect in the distal ureter about 5 cm above the ureteral orifice.  The proximal ureter was found to be moderately dilated.   A 5 French open-ended catheter was then placed in the right ureteral  orifice.  It was not possible to insert a 0.038-inch Bentson guidewire  beyond the level of the stone.  A Glidewire was then inserted and passed  into the renal pelvis.  The distal ureter was then dilated using an 38  French balloon dilating catheter.  The balloon dilating catheter was then  removed, leaving the Glidewire in place.  Through a 5 [place] open-ended  catheter, the Glidewire was exchanged for the Bentson guidewire.  Ureteroscopy was done with a 8.5 French rigid ureteroscope.  The stone was  seen at the level of about 5 cm above the ureteral orifice.  A  full wire  nitinol basket was then inserted into the distal ureter.  The stone was  engaged in the basket and removed without any difficulty.  Examination of  the ureter was done up to the pelvic brim, and no evidence of damage was  noted.  A 6 French 26-cm size stent with a string was then inserted into the  right collecting system.  The instruments were removed.   The patient was transferred to the PACU in satisfactory condition.      ___________________________________________                                            Dennie Maizes, M.D.   SK/MEDQ  D:  05/05/2004  T:  05/05/2004  Job:  [id number]   cc:   Bernerd Limbo. Leona Carry, M.D.  [address]  [place]  [place] [address]  Fax: [phone]

## 2011-05-01 ENCOUNTER — Other Ambulatory Visit (INDEPENDENT_AMBULATORY_CARE_PROVIDER_SITE_OTHER): Payer: Medicare Other

## 2011-05-01 LAB — HEMOGLOBIN A1C: Hgb A1c MFr Bld: 7.2 % — ABNORMAL HIGH (ref 4.6–6.5)

## 2011-05-04 NOTE — Progress Notes (Signed)
Quick Note:  Pt informed ______ 

## 2011-06-30 ENCOUNTER — Other Ambulatory Visit (HOSPITAL_COMMUNITY): Payer: Self-pay | Admitting: Urology

## 2011-06-30 ENCOUNTER — Ambulatory Visit (HOSPITAL_COMMUNITY)
Admission: RE | Admit: 2011-06-30 | Discharge: 2011-06-30 | Disposition: A | Discharge status: Home / Self Care | Payer: Medicare Other | Source: Ambulatory Visit | Attending: Urology | Admitting: Urology

## 2011-06-30 DIAGNOSIS — R109 Unspecified abdominal pain: Secondary | ICD-10-CM | POA: Insufficient documentation

## 2011-06-30 DIAGNOSIS — N200 Calculus of kidney: Secondary | ICD-10-CM | POA: Insufficient documentation

## 2011-06-30 DIAGNOSIS — Z09 Encounter for follow-up examination after completed treatment for conditions other than malignant neoplasm: Secondary | ICD-10-CM | POA: Insufficient documentation

## 2011-07-16 LAB — BASIC METABOLIC PANEL
BUN: 6
CO2: 24
Chloride: 105
Creatinine, Ser: 0.69
Glucose, Bld: 91

## 2011-08-27 ENCOUNTER — Ambulatory Visit (INDEPENDENT_AMBULATORY_CARE_PROVIDER_SITE_OTHER): Payer: Medicare Other | Admitting: Family Medicine

## 2011-08-27 ENCOUNTER — Encounter: Payer: Self-pay | Admitting: Family Medicine

## 2011-08-27 VITALS — BP 122/78 | Temp 98.4°F | Wt 194.0 lb

## 2011-08-27 DIAGNOSIS — I10 Essential (primary) hypertension: Secondary | ICD-10-CM

## 2011-08-27 DIAGNOSIS — Z23 Encounter for immunization: Secondary | ICD-10-CM

## 2011-08-27 DIAGNOSIS — E119 Type 2 diabetes mellitus without complications: Secondary | ICD-10-CM

## 2011-08-27 DIAGNOSIS — E785 Hyperlipidemia, unspecified: Secondary | ICD-10-CM

## 2011-08-27 LAB — HEPATIC FUNCTION PANEL
ALT: 18 U/L (ref 0–35)
AST: 20 U/L (ref 0–37)
Alkaline Phosphatase: 81 U/L (ref 39–117)
Bilirubin, Direct: 0 mg/dL (ref 0.0–0.3)
Total Bilirubin: 0.7 mg/dL (ref 0.3–1.2)

## 2011-08-27 LAB — BASIC METABOLIC PANEL
BUN: 15 mg/dL (ref 6–23)
Calcium: 9.6 mg/dL (ref 8.4–10.5)
Creatinine, Ser: 0.7 mg/dL (ref 0.4–1.2)
GFR: 100.28 mL/min (ref >60.00)
Glucose, Bld: 123 mg/dL — ABNORMAL HIGH (ref 70–99)
Sodium: 139 mEq/L (ref 135–145)

## 2011-08-27 LAB — LIPID PANEL: Total CHOL/HDL Ratio: 3

## 2011-08-27 MED ORDER — SIMVASTATIN 40 MG PO TABS: 40.0000 mg | ORAL_TABLET | Freq: Every day | ORAL | Status: DC

## 2011-08-27 MED ORDER — HYDROCHLOROTHIAZIDE 25 MG PO TABS: 12.5000 mg | ORAL_TABLET | Freq: Every day | ORAL | Status: DC

## 2011-08-27 NOTE — Progress Notes (Signed)
  Subjective:    Patient ID: [name], female    DOB: 04-07-1936, 75 y.o.   MRN: [id number]  HPI  Medical followup. Patient has history of hyperlipidemia, hypertension, and type 2 diabetes. Diabetes controlled with diet and exercise. She has unfortunately gained weight since last visit. Needs flu vaccine. Medications reviewed. Blood pressure stable. No orthostasis. Hyperlipidemia treated with simvastatin. No myalgias. Denies recent chest pains or dyspnea.  Past Medical History  Diagnosis Date  . DEGENERATIVE JOINT DISEASE 08/25/2010  . HYPERLIPIDEMIA 03/08/2009  . HYPERTENSION 03/08/2009  . PREDIABETES 08/23/2009  . Obesity   . Renal calculi    Past Surgical History  Procedure Date  . Tonsillectomy     reports that she has never smoked. She does not have any smokeless tobacco history on file. She reports that she does not drink alcohol or use illicit drugs. family history includes Diabetes in her father. No Known Allergies    Review of Systems  Constitutional: Negative for fatigue.  Eyes: Negative for visual disturbance.  Respiratory: Negative for cough, chest tightness, shortness of breath and wheezing.   Cardiovascular: Negative for chest pain, palpitations and leg swelling.  Neurological: Negative for dizziness, seizures, syncope, weakness, light-headedness and headaches.       Objective:   Physical Exam  Constitutional: She is oriented to person, place, and time. She appears well-developed and well-nourished.  HENT:  Mouth/Throat: Oropharynx is clear and moist.  Neck: Neck supple. No thyromegaly present.  Cardiovascular: Normal rate and regular rhythm.   Murmur heard.      2/6 murmur right upper sternal border  Pulmonary/Chest: Effort normal and breath sounds normal. No respiratory distress. She has no wheezes. She has no rales.  Musculoskeletal: She exhibits no edema.  Lymphadenopathy:    She has no cervical adenopathy.  Neurological: She is alert and oriented to  person, place, and time.          Assessment & Plan:  #1 history of type 2 diabetes. Recheck A1c  #2 dyslipidemia. Check lipid and hepatic panel. Refill simvastatin for one year  #3 hypertension stable and at goal. Recheck basic metabolic panel. Refill HCTZ for one year  #4 health maintenance. Flu vaccine given

## 2011-08-27 NOTE — Patient Instructions (Signed)
Work on weight loss and continue with regular exercise.

## 2011-08-28 NOTE — Progress Notes (Signed)
Quick Note:  Pt informed ______ 

## 2011-09-28 ENCOUNTER — Other Ambulatory Visit: Payer: Self-pay | Admitting: Family Medicine

## 2011-09-28 DIAGNOSIS — Z1231 Encounter for screening mammogram for malignant neoplasm of breast: Secondary | ICD-10-CM

## 2011-10-26 ENCOUNTER — Ambulatory Visit
Admission: RE | Admit: 2011-10-26 | Discharge: 2011-10-26 | Disposition: A | Discharge status: Home / Self Care | Payer: BC Managed Care – PPO | Source: Ambulatory Visit | Attending: Family Medicine | Admitting: Family Medicine

## 2011-10-26 DIAGNOSIS — Z1231 Encounter for screening mammogram for malignant neoplasm of breast: Secondary | ICD-10-CM

## 2011-12-29 ENCOUNTER — Other Ambulatory Visit (HOSPITAL_COMMUNITY): Payer: Self-pay | Admitting: Urology

## 2011-12-29 ENCOUNTER — Ambulatory Visit (HOSPITAL_COMMUNITY)
Admission: RE | Admit: 2011-12-29 | Discharge: 2011-12-29 | Disposition: A | Discharge status: Home / Self Care | Payer: Medicare Other | Source: Ambulatory Visit | Attending: Urology | Admitting: Urology

## 2011-12-29 DIAGNOSIS — M169 Osteoarthritis of hip, unspecified: Secondary | ICD-10-CM | POA: Insufficient documentation

## 2011-12-29 DIAGNOSIS — N200 Calculus of kidney: Secondary | ICD-10-CM | POA: Diagnosis not present

## 2011-12-29 DIAGNOSIS — I998 Other disorder of circulatory system: Secondary | ICD-10-CM | POA: Diagnosis not present

## 2011-12-29 DIAGNOSIS — M1610 Unilateral primary osteoarthritis, unspecified hip: Secondary | ICD-10-CM | POA: Insufficient documentation

## 2012-03-02 ENCOUNTER — Encounter: Payer: Self-pay | Admitting: Family Medicine

## 2012-03-02 ENCOUNTER — Ambulatory Visit (INDEPENDENT_AMBULATORY_CARE_PROVIDER_SITE_OTHER): Payer: Medicare Other | Admitting: Family Medicine

## 2012-03-02 VITALS — BP 142/78 | Temp 98.3°F | Wt 188.0 lb

## 2012-03-02 DIAGNOSIS — I10 Essential (primary) hypertension: Secondary | ICD-10-CM

## 2012-03-02 DIAGNOSIS — R7309 Other abnormal glucose: Secondary | ICD-10-CM | POA: Diagnosis not present

## 2012-03-02 NOTE — Progress Notes (Signed)
  Subjective:    Patient ID: [name], female    DOB: 1935/10/30, 76 y.o.   MRN: [id number]  HPI  Medical followup. Patient has history of type 2 diabetes, hypertension, hyperlipidemia. Lipids were close to goal when checked last fall.  On simvastatin 40 mg daily. HCTZ and also takes aspirin daily. Last A1c 7.2%. No symptoms of hyperglycemia. She has lost 6 pounds due to her efforts since then. Inconsistent exercise. No neuropathy symptoms.  Past Medical History  Diagnosis Date  . DEGENERATIVE JOINT DISEASE 08/25/2010  . HYPERLIPIDEMIA 03/08/2009  . HYPERTENSION 03/08/2009  . PREDIABETES 08/23/2009  . Obesity   . Renal calculi    Past Surgical History  Procedure Date  . Tonsillectomy     reports that she has never smoked. She does not have any smokeless tobacco history on file. She reports that she does not drink alcohol or use illicit drugs. family history includes Diabetes in her father. No Known Allergies    Review of Systems  Constitutional: Negative for fatigue.  Eyes: Negative for visual disturbance.  Respiratory: Negative for cough, chest tightness, shortness of breath and wheezing.   Cardiovascular: Negative for chest pain, palpitations and leg swelling.  Neurological: Negative for dizziness, seizures, syncope, weakness, light-headedness and headaches.       Objective:   Physical Exam  Constitutional: She appears well-developed and well-nourished.  HENT:  Mouth/Throat: Oropharynx is clear and moist.  Neck: Neck supple. No thyromegaly present.  Cardiovascular: Normal rate and regular rhythm.   Pulmonary/Chest: Effort normal and breath sounds normal. No respiratory distress. She has no wheezes. She has no rales.  Musculoskeletal: She exhibits no edema.  Lymphadenopathy:    She has no cervical adenopathy.          Assessment & Plan:  History of prediabetes/type 2 diabetes. Recheck A1c. Continue weight loss efforts. We'll plan routine followup 6 months and  plan to check lipids again then

## 2012-03-03 DIAGNOSIS — H25099 Other age-related incipient cataract, unspecified eye: Secondary | ICD-10-CM | POA: Diagnosis not present

## 2012-03-03 DIAGNOSIS — H18419 Arcus senilis, unspecified eye: Secondary | ICD-10-CM | POA: Diagnosis not present

## 2012-03-03 NOTE — Progress Notes (Signed)
Quick Note:  Pt informed ______ 

## 2012-09-01 ENCOUNTER — Ambulatory Visit (INDEPENDENT_AMBULATORY_CARE_PROVIDER_SITE_OTHER): Payer: Medicare Other | Admitting: Family Medicine

## 2012-09-01 ENCOUNTER — Encounter: Payer: Self-pay | Admitting: Family Medicine

## 2012-09-01 VITALS — BP 140/72 | Temp 97.5°F | Wt 178.0 lb

## 2012-09-01 DIAGNOSIS — Z23 Encounter for immunization: Secondary | ICD-10-CM

## 2012-09-01 DIAGNOSIS — E785 Hyperlipidemia, unspecified: Secondary | ICD-10-CM

## 2012-09-01 DIAGNOSIS — I10 Essential (primary) hypertension: Secondary | ICD-10-CM

## 2012-09-01 DIAGNOSIS — E119 Type 2 diabetes mellitus without complications: Secondary | ICD-10-CM | POA: Diagnosis not present

## 2012-09-01 LAB — HEPATIC FUNCTION PANEL
AST: 23 U/L (ref 0–37)
Alkaline Phosphatase: 91 U/L (ref 39–117)
Bilirubin, Direct: 0.1 mg/dL (ref 0.0–0.3)

## 2012-09-01 LAB — BASIC METABOLIC PANEL
CO2: 26 mEq/L (ref 19–32)
Calcium: 10.2 mg/dL (ref 8.4–10.5)
GFR: 106.72 mL/min (ref >60.00)
Potassium: 4.3 mEq/L (ref 3.5–5.1)
Sodium: 140 mEq/L (ref 135–145)

## 2012-09-01 LAB — LIPID PANEL
HDL: 60.9 mg/dL (ref >39.00)
VLDL: 15 mg/dL (ref 0.0–40.0)

## 2012-09-01 LAB — LDL CHOLESTEROL, DIRECT: Direct LDL: 126 mg/dL

## 2012-09-01 LAB — HEMOGLOBIN A1C: Hgb A1c MFr Bld: 7.2 % — ABNORMAL HIGH (ref 4.6–6.5)

## 2012-09-01 MED ORDER — HYDROCHLOROTHIAZIDE 25 MG PO TABS: 12.5000 mg | ORAL_TABLET | Freq: Every day | ORAL | Status: DC

## 2012-09-01 MED ORDER — SIMVASTATIN 40 MG PO TABS: 40.0000 mg | ORAL_TABLET | Freq: Every day | ORAL | Status: DC

## 2012-09-01 NOTE — Progress Notes (Signed)
Subjective:     Patient ID: [name], female   DOB: April 15, 1936, 76 y.o.   MRN: [id number]  HPI 76 year old with history of HTN, hyperlipidemia, and T2DM here for 52-month follow-up.  Since last visit, she reports that her partner passed away in early 27-Jul-2023 of this year, which was difficult for her, but that she says she is working through by staying active with friends and with her church.  Has lost 10 lbs through her own efforts since last visit, and maintains healthy diet of baked or broiled foods none fried; fruits 2x/day, vegetables, chicken and fish.  Has just started walking on a nature trail 3x/week with a friend from church.  States that she enjoys staying active.  Denies depressed mood or feelings of isolation, chest pain, or SOB.  Past Medical History  Diagnosis Date  . DEGENERATIVE JOINT DISEASE 08/25/2010  . HYPERLIPIDEMIA 03/08/2009  . HYPERTENSION 03/08/2009  . PREDIABETES 08/23/2009  . Obesity   . Renal calculi    Past Surgical History  Procedure Date  . Tonsillectomy     reports that she has never smoked. She does not have any smokeless tobacco history on file. She reports that she does not drink alcohol or use illicit drugs. family history includes Diabetes in her father. No Known Allergies    Review of Systems  Constitutional: Positive for appetite change (just started walking 3x/week).  Respiratory: Negative for chest tightness and shortness of breath.   Cardiovascular: Negative for chest pain.  Neurological: Negative for weakness and light-headedness.       Objective:   Physical Exam  Constitutional: She is oriented to person, place, and time. She appears well-developed and well-nourished. No distress.  HENT:  Head: Normocephalic and atraumatic.  Cardiovascular: Normal rate, regular rhythm and normal heart sounds.   Pulmonary/Chest: Effort normal and breath sounds normal. No respiratory distress.  Neurological: She is alert and oriented to person, place,  and time.  Skin: Skin is warm and dry.       Assessment:     76 year old with history of HTN, hyperlipidemia and T2DM here for 29-month follow-up.    Plan:     1. Hypertension: currently well-controlled with HCTZ 25mg , diet and exercise.  Continue current medication. 2. Hyperlipidemia: lipid panel last year showed good control pt has excellent diet habits, and recently has begun exercising regularly by walking.  Continue simvastatin 40mg  and re-check lipids, liver function, and BMP today. 3. T2DM: well-controlled with diet and exercise.  Re-check A1C today. 4. Follow up in 6 months.  Marthann Schiller MS3  Agree with assessment and plan as per Marthann Schiller, MS 3 Evelena Peat MD

## 2012-09-02 ENCOUNTER — Ambulatory Visit: Payer: Medicare Other | Admitting: Family Medicine

## 2012-09-02 NOTE — Progress Notes (Signed)
Quick Note:  Pt informed ______ 

## 2012-09-27 ENCOUNTER — Other Ambulatory Visit: Payer: Self-pay | Admitting: Family Medicine

## 2012-09-27 DIAGNOSIS — Z1231 Encounter for screening mammogram for malignant neoplasm of breast: Secondary | ICD-10-CM

## 2012-11-01 ENCOUNTER — Ambulatory Visit
Admission: RE | Admit: 2012-11-01 | Discharge: 2012-11-01 | Disposition: A | Discharge status: Home / Self Care | Payer: Self-pay | Source: Ambulatory Visit | Attending: Family Medicine | Admitting: Family Medicine

## 2012-11-01 DIAGNOSIS — Z1231 Encounter for screening mammogram for malignant neoplasm of breast: Secondary | ICD-10-CM

## 2012-11-04 ENCOUNTER — Other Ambulatory Visit: Payer: Self-pay | Admitting: Family Medicine

## 2012-11-04 DIAGNOSIS — R928 Other abnormal and inconclusive findings on diagnostic imaging of breast: Secondary | ICD-10-CM

## 2012-11-17 ENCOUNTER — Other Ambulatory Visit: Payer: Self-pay

## 2012-11-29 ENCOUNTER — Ambulatory Visit
Admission: RE | Admit: 2012-11-29 | Discharge: 2012-11-29 | Disposition: A | Discharge status: Home / Self Care | Payer: Medicare Other | Source: Ambulatory Visit | Attending: Family Medicine | Admitting: Family Medicine

## 2012-11-29 ENCOUNTER — Other Ambulatory Visit: Payer: Self-pay | Admitting: Family Medicine

## 2012-11-29 DIAGNOSIS — R928 Other abnormal and inconclusive findings on diagnostic imaging of breast: Secondary | ICD-10-CM

## 2012-11-29 DIAGNOSIS — N6019 Diffuse cystic mastopathy of unspecified breast: Secondary | ICD-10-CM | POA: Diagnosis not present

## 2012-11-29 DIAGNOSIS — N6320 Unspecified lump in the left breast, unspecified quadrant: Secondary | ICD-10-CM

## 2012-12-20 ENCOUNTER — Ambulatory Visit: Payer: Medicare Other | Admitting: Family Medicine

## 2012-12-22 ENCOUNTER — Ambulatory Visit (INDEPENDENT_AMBULATORY_CARE_PROVIDER_SITE_OTHER): Payer: Medicare Other | Admitting: Family Medicine

## 2012-12-22 ENCOUNTER — Encounter: Payer: Self-pay | Admitting: Family Medicine

## 2012-12-22 VITALS — BP 130/70 | Temp 97.4°F | Wt 179.0 lb

## 2012-12-22 DIAGNOSIS — K921 Melena: Secondary | ICD-10-CM | POA: Diagnosis not present

## 2012-12-22 DIAGNOSIS — K5730 Diverticulosis of large intestine without perforation or abscess without bleeding: Secondary | ICD-10-CM

## 2012-12-22 LAB — CBC WITH DIFFERENTIAL/PLATELET
Basophils Absolute: 0.1 10*3/uL (ref 0.0–0.1)
Basophils Relative: 0.9 % (ref 0.0–3.0)
Eosinophils Absolute: 0.1 10*3/uL (ref 0.0–0.7)
Lymphocytes Relative: 40.4 % (ref 12.0–46.0)
MCHC: 33.3 g/dL (ref 30.0–36.0)
MCV: 85.3 fl (ref 78.0–100.0)
Monocytes Absolute: 1.1 10*3/uL — ABNORMAL HIGH (ref 0.1–1.0)
Neutrophils Relative %: 46.5 % (ref 43.0–77.0)
RBC: 4.48 Mil/uL (ref 3.87–5.11)
RDW: 14.5 % (ref 11.5–14.6)

## 2012-12-22 NOTE — Progress Notes (Addendum)
  Subjective:    Patient ID: [name], female    DOB: 05-25-1936, 77 y.o.   MRN: [id number]  HPI Patient seen with blood per rectum. Sunday she went out to eat. By the afternoon had increasing gaseous discomfort Sunday night she had bright red blood in stool Since then, no further bright blood but has had some dark maroon color stools. Only mild abdominal cramping. No localized tenderness. No constipation. No diarrhea. Appetite and weight stable. No dizziness. No fever or chills.  Known history of diverticulosis. Colonoscopy 2011. No history of known AVMs. No perianal pain. Patient held her aspirin starting couple days ago. No other bleeding complications  Past Medical History  Diagnosis Date  . DEGENERATIVE JOINT DISEASE 08/25/2010  . HYPERLIPIDEMIA 03/08/2009  . HYPERTENSION 03/08/2009  . PREDIABETES 08/23/2009  . Obesity   . Renal calculi    Past Surgical History  Procedure Laterality Date  . Tonsillectomy      reports that she has never smoked. She does not have any smokeless tobacco history on file. She reports that she does not drink alcohol or use illicit drugs. family history includes Diabetes in her father. No Known Allergies    Review of Systems  Constitutional: Negative for fever, chills, appetite change and unexpected weight change.  Respiratory: Negative for shortness of breath.   Cardiovascular: Negative for chest pain.  Gastrointestinal: Positive for blood in stool. Negative for nausea, vomiting, diarrhea, constipation and rectal pain.  Neurological: Negative for dizziness, syncope and weakness.  Hematological: Does not bruise/bleed easily.       Objective:   Physical Exam  Constitutional: She is oriented to person, place, and time. She appears well-developed and well-nourished. No distress.  Neck: Neck supple.  Cardiovascular: Normal rate and regular rhythm.   Pulmonary/Chest: Effort normal and breath sounds normal. No respiratory distress. She has  no wheezes. She has no rales.  Abdominal: Soft. Bowel sounds are normal. She exhibits no distension and no mass. There is no tenderness. There is no rebound and no guarding.  Rectal exam no mass. Just some very dark color stool which is heme positive. No bright blood.  No melena  Neurological: She is alert and oriented to person, place, and time.          Assessment & Plan:  Hematochezia. Differential includes diverticulosis versus AVMs. She has known history of diverticulosis but no history of bleeding. Blood pressure stable and no orthostasis. Colonoscopy 2011 as above. Check CBC. Hold aspirin. Low residue diet. Followup 4 days to reassess and recheck CBC then. Followup immediately for any dizziness, recurrent bright blood, or abdominal pain

## 2012-12-22 NOTE — Patient Instructions (Addendum)
Diverticulosis Diverticulosis is a common condition that develops when small pouches (diverticula) form in the wall of the colon. The risk of diverticulosis increases with age. It happens more often in people who eat a low-fiber diet. Most individuals with diverticulosis have no symptoms. Those individuals with symptoms usually experience abdominal pain, constipation, or loose stools (diarrhea). HOME CARE INSTRUCTIONS   Increase the amount of fiber in your diet as directed by your caregiver or dietician. This may reduce symptoms of diverticulosis.  Your caregiver may recommend taking a dietary fiber supplement.  Drink at least 6 to 8 glasses of water each day to prevent constipation.  Try not to strain when you have a bowel movement.  Your caregiver may recommend avoiding nuts and seeds to prevent complications, although this is still an uncertain benefit.  Only take over-the-counter or prescription medicines for pain, discomfort, or fever as directed by your caregiver. FOODS WITH HIGH FIBER CONTENT INCLUDE:  Fruits. Apple, peach, pear, tangerine, raisins, prunes.  Vegetables. Brussels sprouts, asparagus, broccoli, cabbage, carrot, cauliflower, romaine lettuce, spinach, summer squash, tomato, winter squash, zucchini.  Starchy Vegetables. Baked beans, kidney beans, lima beans, split peas, lentils, potatoes (with skin).  Grains. Whole wheat bread, brown rice, bran flake cereal, plain oatmeal, white rice, shredded wheat, bran muffins. SEEK IMMEDIATE MEDICAL CARE IF:   You develop increasing pain or severe bloating.  You have an oral temperature above 102 F (38.9 C), not controlled by medicine.  You develop vomiting or bowel movements that are bloody or black. Document Released: 07/02/2004 Document Revised: 12/28/2011 Document Reviewed: 03/05/2010 [facility] Patient Information 2013 [place], [place].  Continue to hold aspirin for now Follow up immediately for any increased dizziness,  recurrent bright blood in stools, or increasing abdominal pain. Low residue diet for next few days until follow up- avoid high fiber for next few days until we make sure bleeding stable.

## 2012-12-24 NOTE — Progress Notes (Signed)
Quick Note:  Left a message for pt to return call. ______ 

## 2012-12-24 NOTE — Progress Notes (Signed)
Quick Note:  Attempted to leave a message; operator states "number not in service at this time" ______

## 2012-12-26 ENCOUNTER — Ambulatory Visit (INDEPENDENT_AMBULATORY_CARE_PROVIDER_SITE_OTHER): Payer: Medicare Other | Admitting: Family Medicine

## 2012-12-26 ENCOUNTER — Encounter: Payer: Self-pay | Admitting: Family Medicine

## 2012-12-26 VITALS — BP 110/70 | Temp 99.3°F | Wt 179.0 lb

## 2012-12-26 DIAGNOSIS — K921 Melena: Secondary | ICD-10-CM

## 2012-12-26 LAB — CBC WITH DIFFERENTIAL/PLATELET
Basophils Absolute: 0.1 10*3/uL (ref 0.0–0.1)
Basophils Relative: 1.1 % (ref 0.0–3.0)
Eosinophils Absolute: 0.1 10*3/uL (ref 0.0–0.7)
Hemoglobin: 11.1 g/dL — ABNORMAL LOW (ref 12.0–15.0)
Lymphocytes Relative: 38.2 % (ref 12.0–46.0)
MCHC: 33.5 g/dL (ref 30.0–36.0)
Monocytes Relative: 12 % (ref 3.0–12.0)
Neutrophils Relative %: 47.7 % (ref 43.0–77.0)
RBC: 3.89 Mil/uL (ref 3.87–5.11)
RDW: 14.2 % (ref 11.5–14.6)

## 2012-12-26 NOTE — Progress Notes (Signed)
Quick Note:  Pt informed ______ 

## 2012-12-26 NOTE — Progress Notes (Signed)
  Subjective:    Patient ID: [name], female    DOB: 04-10-1936, 77 y.o.   MRN: [id number]  HPI Followup hematochezia Refer to prior note. Suspected diverticulosis versus AVM bleed Previous colonoscopy 2011 with no polyps Her hemoglobin was 12.7 and no orthostasis She has not seen any evidence for blood in her stools for the past few days She has been a low-residue diet and holding aspirin. No dizziness. No abdominal pain.  Past Medical History  Diagnosis Date  . DEGENERATIVE JOINT DISEASE 08/25/2010  . HYPERLIPIDEMIA 03/08/2009  . HYPERTENSION 03/08/2009  . PREDIABETES 08/23/2009  . Obesity   . Renal calculi    Past Surgical History  Procedure Laterality Date  . Tonsillectomy      reports that she has never smoked. She does not have any smokeless tobacco history on file. She reports that she does not drink alcohol or use illicit drugs. family history includes Diabetes in her father. No Known Allergies    Review of Systems  Constitutional: Negative for appetite change and unexpected weight change.  Respiratory: Negative for cough and shortness of breath.   Cardiovascular: Negative for chest pain.  Gastrointestinal: Negative for nausea, vomiting, abdominal pain and blood in stool.  Neurological: Negative for dizziness.       Objective:   Physical Exam  Constitutional: She appears well-developed and well-nourished.  Cardiovascular: Normal rate and regular rhythm.   Pulmonary/Chest: Effort normal and breath sounds normal. No respiratory distress. She has no wheezes. She has no rales.  Musculoskeletal: She exhibits no edema.          Assessment & Plan:  Hematochezia. Symptomatically improved. Question recent AVM versus diverticulosis bleed. Recheck CBC today. If symptomatically stable may resume baby aspirin by this week

## 2012-12-26 NOTE — Patient Instructions (Addendum)
By end of this week if symptoms stable may resume regular diet and baby aspirin one daily.

## 2012-12-27 ENCOUNTER — Other Ambulatory Visit: Payer: Self-pay | Admitting: *Deleted

## 2012-12-27 DIAGNOSIS — D649 Anemia, unspecified: Secondary | ICD-10-CM

## 2012-12-27 NOTE — Progress Notes (Signed)
Quick Note:  Pt informed, she takes Centrum silver over 60 already, so she will add an iron supplement. Pt wanted me to ask Dr Caryl Never when she can begin eating raw fruit and lettuce salad again? ______

## 2013-01-24 ENCOUNTER — Ambulatory Visit (INDEPENDENT_AMBULATORY_CARE_PROVIDER_SITE_OTHER): Payer: Medicare Other | Admitting: Family Medicine

## 2013-01-24 ENCOUNTER — Encounter: Payer: Self-pay | Admitting: Family Medicine

## 2013-01-24 VITALS — BP 140/72 | Temp 98.4°F | Wt 183.0 lb

## 2013-01-24 DIAGNOSIS — D649 Anemia, unspecified: Secondary | ICD-10-CM

## 2013-01-24 LAB — CBC WITH DIFFERENTIAL/PLATELET
Basophils Relative: 1.5 % (ref 0.0–3.0)
HCT: 41.6 % (ref 36.0–46.0)
Hemoglobin: 13.5 g/dL (ref 12.0–15.0)
Lymphocytes Relative: 43.2 % (ref 12.0–46.0)
Lymphs Abs: 2.6 10*3/uL (ref 0.7–4.0)
Monocytes Relative: 12.5 % — ABNORMAL HIGH (ref 3.0–12.0)
Neutro Abs: 2.5 10*3/uL (ref 1.4–7.7)
RBC: 4.76 Mil/uL (ref 3.87–5.11)

## 2013-01-24 NOTE — Patient Instructions (Signed)
Go ahead and introduce back whole grains and fruits and vegetables.

## 2013-01-24 NOTE — Progress Notes (Signed)
  Subjective:    Patient ID: [name], female    DOB: 10-Nov-1935, 77 y.o.   MRN: [id number]  HPI Patient seen for medical followup. Refer to recent notes Patient had lower GI bleed -presumably diverticulosis bleed. Last hemoglobin 11.1. No dizziness. No further bleeding. Never had any significant abdominal pain. No fatigue issues.  She is taking iron sulfate 325 mg once daily in addition to multivitamin with iron. Overall feels well. She has been on low residue diet for the past month and has questions regarding introducing back fiber.  Past Medical History  Diagnosis Date  . DEGENERATIVE JOINT DISEASE 08/25/2010  . HYPERLIPIDEMIA 03/08/2009  . HYPERTENSION 03/08/2009  . PREDIABETES 08/23/2009  . Obesity   . Renal calculi    Past Surgical History  Procedure Laterality Date  . Tonsillectomy      reports that she has never smoked. She does not have any smokeless tobacco history on file. She reports that she does not drink alcohol or use illicit drugs. family history includes Diabetes in her father. No Known Allergies    Review of Systems  Constitutional: Negative for appetite change and unexpected weight change.  Gastrointestinal: Negative for nausea, vomiting, abdominal pain, diarrhea and blood in stool.  Genitourinary: Negative for menstrual problem.  Neurological: Negative for dizziness.       Objective:   Physical Exam  Constitutional: She appears well-developed and well-nourished.  Cardiovascular: Normal rate and regular rhythm.   Pulmonary/Chest: Effort normal and breath sounds normal. No respiratory distress. She has no wheezes. She has no rales.  Abdominal: Soft. There is no tenderness.  Musculoskeletal: She exhibits no edema.          Assessment & Plan:  Patient is seen with mild anemia related to recent lower GI bleed. Differential: AVMs versus diverticulosis. Recheck CBC. Continue iron supplementation. Gradually introduce back whole grains and fiber.

## 2013-01-25 NOTE — Progress Notes (Signed)
Quick Note:  Pt informed ______ 

## 2013-03-01 ENCOUNTER — Ambulatory Visit (INDEPENDENT_AMBULATORY_CARE_PROVIDER_SITE_OTHER): Payer: Medicare Other | Admitting: Family Medicine

## 2013-03-01 ENCOUNTER — Encounter: Payer: Self-pay | Admitting: Family Medicine

## 2013-03-01 VITALS — BP 128/68 | Temp 98.4°F | Wt 180.0 lb

## 2013-03-01 DIAGNOSIS — D649 Anemia, unspecified: Secondary | ICD-10-CM

## 2013-03-01 DIAGNOSIS — I10 Essential (primary) hypertension: Secondary | ICD-10-CM

## 2013-03-01 NOTE — Progress Notes (Signed)
  Subjective:    Patient ID: [name], female    DOB: 13-May-1936, 77 y.o.   MRN: [id number]  HPI  Patient has history of hyperlipidemia, hypertension, type 2 diabetes, and recent anemia We suspect that she may have had diverticulosis bleed. Stabilized rapidly with hemoglobin last month 13. No recurrent bloody stools. No dizziness. She has resumed her normal diet.  Blood sugars have been well controlled. Last A1c 7.2%. No neuropathy symptoms.  Past Medical History  Diagnosis Date  . DEGENERATIVE JOINT DISEASE 08/25/2010  . HYPERLIPIDEMIA 03/08/2009  . HYPERTENSION 03/08/2009  . PREDIABETES 08/23/2009  . Obesity   . Renal calculi    Past Surgical History  Procedure Laterality Date  . Tonsillectomy      reports that she has never smoked. She does not have any smokeless tobacco history on file. She reports that she does not drink alcohol or use illicit drugs. family history includes Diabetes in her father. No Known Allergies   Review of Systems  Constitutional: Negative for appetite change, fatigue and unexpected weight change.  Eyes: Negative for visual disturbance.  Respiratory: Negative for cough, chest tightness, shortness of breath and wheezing.   Cardiovascular: Negative for chest pain, palpitations and leg swelling.  Neurological: Negative for dizziness, seizures, syncope, weakness, light-headedness and headaches.       Objective:   Physical Exam  Constitutional: She appears well-developed and well-nourished.  Eyes:  Conjunctivae are pink  Neck: Neck supple. No thyromegaly present.  Cardiovascular: Normal rate and regular rhythm.   Pulmonary/Chest: Effort normal and breath sounds normal. No respiratory distress. She has no wheezes. She has no rales.  Musculoskeletal: She exhibits no edema.          Assessment & Plan:  #1 recent anemia probably secondary to AVMs or diverticulosis. Clinically stable. We elected not repeat CBC since last one month ago is back  to normal. No recurrent symptoms Recent colonoscopy as per previous notes diverticulosis no other abnormality #2 hypertension. Stable. Check basic metabolic panel at followup and A1c

## 2013-03-01 NOTE — Patient Instructions (Addendum)
Follow up immediately for any dizziness or recurrent blood in stool

## 2013-05-04 ENCOUNTER — Ambulatory Visit (HOSPITAL_COMMUNITY)
Admission: RE | Admit: 2013-05-04 | Discharge: 2013-05-04 | Disposition: A | Discharge status: Home / Self Care | Payer: Medicare Other | Source: Ambulatory Visit | Attending: Urology | Admitting: Urology

## 2013-05-04 ENCOUNTER — Other Ambulatory Visit (HOSPITAL_COMMUNITY): Payer: Self-pay | Admitting: Urology

## 2013-05-04 DIAGNOSIS — N200 Calculus of kidney: Secondary | ICD-10-CM

## 2013-06-01 ENCOUNTER — Encounter: Payer: Self-pay | Admitting: Family Medicine

## 2013-06-01 ENCOUNTER — Ambulatory Visit (INDEPENDENT_AMBULATORY_CARE_PROVIDER_SITE_OTHER): Payer: Medicare Other | Admitting: Family Medicine

## 2013-06-01 VITALS — BP 128/64 | HR 84 | Wt 181.0 lb

## 2013-06-01 DIAGNOSIS — I10 Essential (primary) hypertension: Secondary | ICD-10-CM

## 2013-06-01 DIAGNOSIS — E119 Type 2 diabetes mellitus without complications: Secondary | ICD-10-CM

## 2013-06-01 DIAGNOSIS — N200 Calculus of kidney: Secondary | ICD-10-CM

## 2013-06-01 LAB — HM DIABETES FOOT EXAM: HM Diabetic Foot Exam: NORMAL

## 2013-06-01 MED ORDER — HYDROCHLOROTHIAZIDE 25 MG PO TABS: 12.5000 mg | ORAL_TABLET | Freq: Every day | ORAL | Status: DC

## 2013-06-01 NOTE — Progress Notes (Signed)
  Subjective:    Patient ID: [name], female    DOB: 04-14-1936, 77 y.o.   MRN: [id number]  HPI Here for medical followup  Type 2 diabetes. Currently followed off medications. Does not monitor blood sugars regularly. No symptoms of hyperglycemia. A1c's have been consistently around 7.2%. She's been very compliant with diet.  Hypertension treated with HCTZ. Blood pressure stable. No orthostasis. No recent chest pains. Hyperlipidemia treated with simvastatin. No myalgias. No history of cardiac disease or peripheral vascular disease. Patient nonsmoker.  She has history of anemia and suspected diverticulosis bleed several months ago. Most recent CBC was normal. She has not had any bloody stools. She had previous colonoscopy within a couple of years. No history of acute diverticulitis.  Past Medical History  Diagnosis Date  . DEGENERATIVE JOINT DISEASE 08/25/2010  . HYPERLIPIDEMIA 03/08/2009  . HYPERTENSION 03/08/2009  . PREDIABETES 08/23/2009  . Obesity   . Renal calculi    Past Surgical History  Procedure Laterality Date  . Tonsillectomy      reports that she has never smoked. She does not have any smokeless tobacco history on file. She reports that she does not drink alcohol or use illicit drugs. family history includes Diabetes in her father. No Known Allergies    Review of Systems  Constitutional: Negative for fatigue and unexpected weight change.  HENT: Negative for trouble swallowing.   Eyes: Negative for visual disturbance.  Respiratory: Negative for cough, chest tightness, shortness of breath and wheezing.   Cardiovascular: Negative for chest pain, palpitations and leg swelling.  Endocrine: Negative for polydipsia and polyuria.  Genitourinary: Negative for dysuria.  Neurological: Negative for dizziness, seizures, syncope, weakness, light-headedness and headaches.       Objective:   Physical Exam  Constitutional: She is oriented to person, place, and time. She  appears well-developed and well-nourished.  HENT:  Mouth/Throat: Oropharynx is clear and moist.  Neck: Neck supple. No thyromegaly present.  Cardiovascular: Normal rate and regular rhythm.   Pulmonary/Chest: Effort normal and breath sounds normal. No respiratory distress. She has no wheezes. She has no rales.  Musculoskeletal: She exhibits no edema.  Lymphadenopathy:    She has no cervical adenopathy.  Neurological: She is alert and oriented to person, place, and time.          Assessment & Plan:  #1 type 2 diabetes. History of good control. Needs to set up eye exam. Repeat A1c. Needs urine microalbumin screen at followup. We failed to order this today. She does not take ACE inhibitor #2 hypertension. Stable. Refill HCTZ for one year #3 hyperlipidemia. Recheck lipids at followup in 3 months #4 history of recent anemia which has been corrected with iron replacement

## 2013-06-05 DIAGNOSIS — H25099 Other age-related incipient cataract, unspecified eye: Secondary | ICD-10-CM | POA: Diagnosis not present

## 2013-06-05 LAB — HM DIABETES EYE EXAM: HM Diabetic Eye Exam: NORMAL

## 2013-08-18 ENCOUNTER — Other Ambulatory Visit: Payer: Self-pay | Admitting: Family Medicine

## 2013-09-01 ENCOUNTER — Ambulatory Visit (INDEPENDENT_AMBULATORY_CARE_PROVIDER_SITE_OTHER): Payer: Medicare Other | Admitting: Family Medicine

## 2013-09-01 ENCOUNTER — Encounter: Payer: Self-pay | Admitting: Family Medicine

## 2013-09-01 VITALS — BP 132/66 | HR 72 | Temp 97.7°F | Wt 177.0 lb

## 2013-09-01 DIAGNOSIS — E785 Hyperlipidemia, unspecified: Secondary | ICD-10-CM | POA: Diagnosis not present

## 2013-09-01 DIAGNOSIS — E669 Obesity, unspecified: Secondary | ICD-10-CM | POA: Insufficient documentation

## 2013-09-01 DIAGNOSIS — Z23 Encounter for immunization: Secondary | ICD-10-CM | POA: Diagnosis not present

## 2013-09-01 DIAGNOSIS — I10 Essential (primary) hypertension: Secondary | ICD-10-CM

## 2013-09-01 DIAGNOSIS — E119 Type 2 diabetes mellitus without complications: Secondary | ICD-10-CM

## 2013-09-01 LAB — LIPID PANEL
Cholesterol: 191 mg/dL (ref 0–200)
Total CHOL/HDL Ratio: 3
Triglycerides: 83 mg/dL (ref 0.0–149.0)

## 2013-09-01 LAB — BASIC METABOLIC PANEL
CO2: 29 mEq/L (ref 19–32)
Calcium: 10 mg/dL (ref 8.4–10.5)
Creatinine, Ser: 0.7 mg/dL (ref 0.4–1.2)
GFR: 99.73 mL/min (ref >60.00)

## 2013-09-01 LAB — HM DIABETES FOOT EXAM: HM Diabetic Foot Exam: NORMAL

## 2013-09-01 LAB — HEPATIC FUNCTION PANEL
ALT: 18 U/L (ref 0–35)
AST: 20 U/L (ref 0–37)
Albumin: 4 g/dL (ref 3.5–5.2)
Alkaline Phosphatase: 84 U/L (ref 39–117)
Total Protein: 8.1 g/dL (ref 6.0–8.3)

## 2013-09-01 NOTE — Progress Notes (Signed)
  Subjective:    Patient ID: [name], female    DOB: 07/10/1936, 77 y.o.   MRN: [id number]  HPI  Patient here for medical followup. She has history of type 2 diabetes, hypertension, hyperlipidemia. She has history of diverticulosis bleed with anemia. No recent dizziness. She has osteoarthritis involving multiple joints but coping fairly well. Medications reviewed. Compliant with all. Last hemoglobin A1c 7.3%. No symptoms of hyperglycemia. We have held off on medications thus far. No recent chest pains. No dizziness. Needs flu vaccine. Pneumovax up-to-date. She had eye exam since last visit with no retinopathy noted.  Past Medical History  Diagnosis Date  . DEGENERATIVE JOINT DISEASE 08/25/2010  . HYPERLIPIDEMIA 03/08/2009  . HYPERTENSION 03/08/2009  . PREDIABETES 08/23/2009  . Obesity   . Renal calculi    Past Surgical History  Procedure Laterality Date  . Tonsillectomy      reports that she has never smoked. She does not have any smokeless tobacco history on file. She reports that she does not drink alcohol or use illicit drugs. family history includes Diabetes in her father. No Known Allergies   Review of Systems  Constitutional: Negative for fever, activity change, appetite change, fatigue and unexpected weight change.  HENT: Negative for ear pain, hearing loss, sore throat and trouble swallowing.   Eyes: Negative for visual disturbance.  Respiratory: Negative for cough and shortness of breath.   Cardiovascular: Negative for chest pain and palpitations.  Gastrointestinal: Negative for abdominal pain, diarrhea, constipation and blood in stool.  Genitourinary: Negative for dysuria and hematuria.  Musculoskeletal: Negative for arthralgias, back pain and myalgias.  Skin: Negative for rash.  Neurological: Negative for dizziness, syncope and headaches.  Hematological: Negative for adenopathy.  Psychiatric/Behavioral: Negative for confusion and dysphoric mood.       Objective:    Physical Exam  Constitutional: She appears well-developed and well-nourished.  Neck: Neck supple. No thyromegaly present.  Cardiovascular: Normal rate and regular rhythm.   Murmur heard. Pulmonary/Chest: Effort normal and breath sounds normal. No respiratory distress. She has no wheezes. She has no rales.  Musculoskeletal: She exhibits no edema.  Skin:  Feet reveal no skin lesions. Good distal foot pulses. Good capillary refill. No calluses. Normal sensation with monofilament testing           Assessment & Plan:  #1 type 2 diabetes. History of marginal control. Recheck A1c #2 hyperlipidemia. Patient on simvastatin. Check lipid and hepatic panel. #3 hypertension stable check basic metabolic panel with patient on HCTZ.

## 2013-10-24 ENCOUNTER — Other Ambulatory Visit: Payer: Self-pay

## 2013-10-24 DIAGNOSIS — Z1231 Encounter for screening mammogram for malignant neoplasm of breast: Secondary | ICD-10-CM

## 2013-11-08 ENCOUNTER — Other Ambulatory Visit: Payer: Self-pay | Admitting: Family Medicine

## 2013-11-10 ENCOUNTER — Ambulatory Visit
Admission: RE | Admit: 2013-11-10 | Discharge: 2013-11-10 | Disposition: A | Discharge status: Home / Self Care | Payer: Medicare Other | Source: Ambulatory Visit

## 2013-11-10 DIAGNOSIS — Z1231 Encounter for screening mammogram for malignant neoplasm of breast: Secondary | ICD-10-CM

## 2014-03-01 ENCOUNTER — Encounter: Payer: Self-pay | Admitting: Family Medicine

## 2014-03-01 ENCOUNTER — Ambulatory Visit (INDEPENDENT_AMBULATORY_CARE_PROVIDER_SITE_OTHER): Payer: Medicare Other | Admitting: Family Medicine

## 2014-03-01 VITALS — BP 132/70 | HR 76 | Temp 97.4°F | Wt 189.0 lb

## 2014-03-01 DIAGNOSIS — E119 Type 2 diabetes mellitus without complications: Secondary | ICD-10-CM

## 2014-03-01 DIAGNOSIS — Z23 Encounter for immunization: Secondary | ICD-10-CM

## 2014-03-01 DIAGNOSIS — I10 Essential (primary) hypertension: Secondary | ICD-10-CM

## 2014-03-01 DIAGNOSIS — H6120 Impacted cerumen, unspecified ear: Secondary | ICD-10-CM | POA: Diagnosis not present

## 2014-03-01 LAB — HEMOGLOBIN A1C: Hgb A1c MFr Bld: 7.3 % — ABNORMAL HIGH (ref 4.6–6.5)

## 2014-03-01 MED ORDER — GLUCOSE BLOOD VI STRP: ORAL_STRIP | Status: DC

## 2014-03-01 MED ORDER — ACCU-CHEK SOFTCLIX LANCETS MISC: Status: DC

## 2014-03-01 NOTE — Addendum Note (Signed)
Addended by: Thomasena EdisFLOYD, Nataliya Graig E on: 03/01/2014 03:46 PM   Modules accepted: Orders

## 2014-03-01 NOTE — Progress Notes (Signed)
   Subjective:    Patient ID: [name], female    DOB: 03/11/1936, 78 y.o.   MRN: [id number]  HPI Comments: Patient is a 78 year old female with PMH of HTN, T2 Diabetes, and hyperlipidemia who presents for 6 month follow up appointment. Other complaints include ear fullness and rhinorrhea x 2 months. Patient is compliant with all medications and has had an eye exam in the past year. Patient denies dizziness, fever, chills, recent sickness, muscle aches, lymphadenopathy, chest pain, palpitations, shortness of breath, cough, abdominal pain.    Review of Systems  Constitutional: Negative for fever and chills.  HENT: Positive for rhinorrhea.   Respiratory: Negative for cough and shortness of breath.   Cardiovascular: Negative for chest pain and palpitations.  Gastrointestinal: Negative for abdominal pain.  Musculoskeletal: Negative for arthralgias and myalgias.  Neurological: Negative for dizziness.       Objective:   Physical Exam  Constitutional: She appears well-developed and well-nourished.  HENT:  Head: Normocephalic.  Left Ear: Tympanic membrane and ear canal normal.  Cerumen impaction obstructing visualization of right TM.   Eyes: Conjunctivae are normal. Pupils are equal, round, and reactive to light.  Cardiovascular: Normal rate and intact distal pulses.   Murmur heard. Pulses:      Radial pulses are 2+ on the right side, and 2+ on the left side.  Pulmonary/Chest: Effort normal and breath sounds normal.  Neurological: She is alert. No sensory deficit.  No impairment on monofilament foot exam.  Skin: Skin is warm and dry.      Assessment & Plan:  1. Cerumen impaction Irrigate right ear. 2. Health Maintenance A1c today, Prevnar 13, new meter for glucose monitoring.   GrenadaBrittany Brandan Robicheaux, PA-S  As above.  Hypertension stable.   Diabetes has been well controlled. Reassess A1C- she does not monitor sugars at home currently.  New monitor given.  Evelena PeatBruce Burchette, MD

## 2014-03-01 NOTE — Progress Notes (Signed)
Pre visit review using our clinic review tool, if applicable. No additional management support is needed unless otherwise documented below in the visit note. 

## 2014-03-01 NOTE — Patient Instructions (Signed)
Cerumen Impaction A cerumen impaction is when the wax in your ear forms a plug. This plug usually causes reduced hearing. Sometimes it also causes an earache or dizziness. Removing a cerumen impaction can be difficult and painful. The wax sticks to the ear canal. The canal is sensitive and bleeds easily. If you try to remove a heavy wax buildup with a cotton tipped swab, you may push it in further. Irrigation with water, suction, and small ear curettes may be used to clear out the wax. If the impaction is fixed to the skin in the ear canal, ear drops may be needed for a few days to loosen the wax. People who build up a lot of wax frequently can use ear wax removal products available in your local drugstore. SEEK MEDICAL CARE IF:  You develop an earache, increased hearing loss, or marked dizziness. Document Released: 11/12/2004 Document Revised: 12/28/2011 Document Reviewed: 01/02/2010 [employer] Patient Information 2014 [employer].  

## 2014-03-02 ENCOUNTER — Telehealth: Payer: Self-pay | Admitting: Family Medicine

## 2014-03-02 NOTE — Telephone Encounter (Signed)
Relevant patient education mailed to patient.  

## 2014-03-05 ENCOUNTER — Telehealth: Payer: Self-pay | Admitting: Family Medicine

## 2014-03-05 NOTE — Telephone Encounter (Signed)
Pt was seen on 03/01/14, pt was given a new meter for her diabetes, pt states the meter worked until yesterday and now she is having trouble with it. Pt states Fleet ContrasRachel helped her with working the meter and informed her to call her if she had any problems with it. Pt would like a call back to assist her with getting the meter to work again. Pt would like a call back asap.

## 2014-03-06 NOTE — Telephone Encounter (Signed)
Left message for patient to return call.

## 2014-03-07 NOTE — Telephone Encounter (Signed)
Spoke with patient. Pt informed to come get another meter at the office.

## 2014-04-04 ENCOUNTER — Other Ambulatory Visit: Payer: Self-pay | Admitting: Family Medicine

## 2014-04-30 ENCOUNTER — Ambulatory Visit (HOSPITAL_COMMUNITY)
Admission: RE | Admit: 2014-04-30 | Discharge: 2014-04-30 | Disposition: A | Discharge status: Home / Self Care | Payer: Medicare Other | Source: Ambulatory Visit | Attending: Urology | Admitting: Urology

## 2014-04-30 ENCOUNTER — Other Ambulatory Visit (HOSPITAL_COMMUNITY): Payer: Self-pay | Admitting: Urology

## 2014-04-30 DIAGNOSIS — N200 Calculus of kidney: Secondary | ICD-10-CM

## 2014-04-30 DIAGNOSIS — M169 Osteoarthritis of hip, unspecified: Secondary | ICD-10-CM | POA: Insufficient documentation

## 2014-04-30 DIAGNOSIS — M1610 Unilateral primary osteoarthritis, unspecified hip: Secondary | ICD-10-CM | POA: Insufficient documentation

## 2014-06-11 DIAGNOSIS — H18419 Arcus senilis, unspecified eye: Secondary | ICD-10-CM | POA: Diagnosis not present

## 2014-06-11 DIAGNOSIS — H25099 Other age-related incipient cataract, unspecified eye: Secondary | ICD-10-CM | POA: Diagnosis not present

## 2014-07-30 ENCOUNTER — Other Ambulatory Visit: Payer: Self-pay | Admitting: Family Medicine

## 2014-08-31 ENCOUNTER — Ambulatory Visit (INDEPENDENT_AMBULATORY_CARE_PROVIDER_SITE_OTHER): Payer: Medicare Other | Admitting: Family Medicine

## 2014-08-31 ENCOUNTER — Encounter: Payer: Self-pay | Admitting: Family Medicine

## 2014-08-31 VITALS — BP 130/80 | HR 68 | Temp 98.0°F | Wt 179.0 lb

## 2014-08-31 DIAGNOSIS — I10 Essential (primary) hypertension: Secondary | ICD-10-CM

## 2014-08-31 DIAGNOSIS — E119 Type 2 diabetes mellitus without complications: Secondary | ICD-10-CM | POA: Diagnosis not present

## 2014-08-31 DIAGNOSIS — E785 Hyperlipidemia, unspecified: Secondary | ICD-10-CM | POA: Diagnosis not present

## 2014-08-31 LAB — BASIC METABOLIC PANEL
BUN: 14 mg/dL (ref 6–23)
CHLORIDE: 105 meq/L (ref 96–112)
CO2: 25 mEq/L (ref 19–32)
Calcium: 9.8 mg/dL (ref 8.4–10.5)
Creatinine, Ser: 0.7 mg/dL (ref 0.4–1.2)
GFR: 99.47 mL/min (ref >60.00)
GLUCOSE: 112 mg/dL — AB (ref 70–99)
POTASSIUM: 3.9 meq/L (ref 3.5–5.1)
SODIUM: 139 meq/L (ref 135–145)

## 2014-08-31 LAB — LIPID PANEL
CHOLESTEROL: 177 mg/dL (ref 0–200)
HDL: 42.2 mg/dL (ref >39.00)
LDL CALC: 120 mg/dL — AB (ref 0–99)
NonHDL: 134.8
Total CHOL/HDL Ratio: 4
Triglycerides: 75 mg/dL (ref 0.0–149.0)
VLDL: 15 mg/dL (ref 0.0–40.0)

## 2014-08-31 LAB — HEPATIC FUNCTION PANEL
ALBUMIN: 3.4 g/dL — AB (ref 3.5–5.2)
ALT: 15 U/L (ref 0–35)
AST: 20 U/L (ref 0–37)
Alkaline Phosphatase: 85 U/L (ref 39–117)
BILIRUBIN TOTAL: 0.9 mg/dL (ref 0.2–1.2)
Bilirubin, Direct: 0 mg/dL (ref 0.0–0.3)
Total Protein: 7.8 g/dL (ref 6.0–8.3)

## 2014-08-31 LAB — HEMOGLOBIN A1C: Hgb A1c MFr Bld: 6.9 % — ABNORMAL HIGH (ref 4.6–6.5)

## 2014-08-31 LAB — MICROALBUMIN / CREATININE URINE RATIO
Creatinine,U: 51.9 mg/dL
Microalb Creat Ratio: 0.2 mg/g (ref 0.0–30.0)
Microalb, Ur: 0.1 mg/dL (ref 0.0–1.9)

## 2014-08-31 NOTE — Progress Notes (Signed)
   Subjective:    Patient ID: [name], female    DOB: 1935/11/06, 78 y.o.   MRN: [id number]  HPI Patient here for routine medical follow-up. She has history of obesity, type 2 diabetes, kidney stones, hyperlipidemia, hypertension. She's lost about 10 pounds due to her efforts since last visit. Blood sugars been stable. She has declined medication in the past. A1c's have been mostly low 7 range. Blood pressures been stable. Her medications reviewed. Compliant with all. She gets regular eye exams. Already had flu vaccine. Other recommendations up-to-date. No recent falls. No recent chest pain or dyspnea.  Past Medical History  Diagnosis Date  . DEGENERATIVE JOINT DISEASE 08/25/2010  . HYPERLIPIDEMIA 03/08/2009  . HYPERTENSION 03/08/2009  . PREDIABETES 08/23/2009  . Obesity   . Renal calculi    Past Surgical History  Procedure Laterality Date  . Tonsillectomy      reports that she has never smoked. She does not have any smokeless tobacco history on file. She reports that she does not drink alcohol or use illicit drugs. family history includes Diabetes in her father. No Known Allergies    Review of Systems  Constitutional: Negative for appetite change and fatigue.  Eyes: Negative for visual disturbance.  Respiratory: Negative for cough, chest tightness, shortness of breath and wheezing.   Cardiovascular: Negative for chest pain, palpitations and leg swelling.  Endocrine: Negative for polydipsia and polyuria.  Neurological: Negative for dizziness, seizures, syncope, weakness, light-headedness and headaches.       Objective:   Physical Exam  Constitutional: She appears well-developed and well-nourished.  Neck: Neck supple. No thyromegaly present.  Cardiovascular: Normal rate and regular rhythm.  Exam reveals no gallop.   Murmur heard. Murmur most prominent aortic area. This is unchanged in about 3 out of 6  Pulmonary/Chest: Effort normal and breath sounds normal. No respiratory  distress. She has no wheezes. She has no rales.  Musculoskeletal: She exhibits no edema.          Assessment & Plan:  #1 type 2 diabetes. History of fair control. Recheck A1c and urine microalbumin. Continue yearly eye exam #2 hypertension stable and at goal. Continue current medications. Check basic metabolic panel #3 hyperlipidemia. Check lipid and hepatic panel

## 2014-08-31 NOTE — Progress Notes (Signed)
Pre visit review using our clinic review tool, if applicable. No additional management support is needed unless otherwise documented below in the visit note. Lab Results  Component Value Date   HGBA1C 7.3* 03/01/2014   HGBA1C 7.2* 09/01/2013   HGBA1C 7.3* 06/01/2013   Lab Results  Component Value Date   LDLCALC 114* 09/01/2013   CREATININE 0.7 09/01/2013

## 2014-10-22 ENCOUNTER — Other Ambulatory Visit: Payer: Self-pay

## 2014-10-22 DIAGNOSIS — Z1231 Encounter for screening mammogram for malignant neoplasm of breast: Secondary | ICD-10-CM

## 2014-11-12 ENCOUNTER — Ambulatory Visit: Payer: Medicare Other

## 2014-11-20 ENCOUNTER — Ambulatory Visit
Admission: RE | Admit: 2014-11-20 | Discharge: 2014-11-20 | Disposition: A | Discharge status: Home / Self Care | Payer: Medicare Other | Source: Ambulatory Visit

## 2014-11-20 DIAGNOSIS — Z1231 Encounter for screening mammogram for malignant neoplasm of breast: Secondary | ICD-10-CM | POA: Diagnosis not present

## 2014-11-23 ENCOUNTER — Other Ambulatory Visit: Payer: Self-pay | Admitting: Family Medicine

## 2015-02-24 ENCOUNTER — Other Ambulatory Visit: Payer: Self-pay | Admitting: Family Medicine

## 2015-03-01 ENCOUNTER — Ambulatory Visit (INDEPENDENT_AMBULATORY_CARE_PROVIDER_SITE_OTHER): Payer: Medicare Other | Admitting: Family Medicine

## 2015-03-01 ENCOUNTER — Encounter: Payer: Self-pay | Admitting: Family Medicine

## 2015-03-01 VITALS — BP 128/70 | HR 74 | Temp 97.9°F | Wt 178.0 lb

## 2015-03-01 DIAGNOSIS — E785 Hyperlipidemia, unspecified: Secondary | ICD-10-CM | POA: Diagnosis not present

## 2015-03-01 DIAGNOSIS — I10 Essential (primary) hypertension: Secondary | ICD-10-CM

## 2015-03-01 DIAGNOSIS — E119 Type 2 diabetes mellitus without complications: Secondary | ICD-10-CM

## 2015-03-01 LAB — HEMOGLOBIN A1C: HEMOGLOBIN A1C: 7.1 % — AB (ref 4.6–6.5)

## 2015-03-01 NOTE — Progress Notes (Signed)
Pre visit review using our clinic review tool, if applicable. No additional management support is needed unless otherwise documented below in the visit note. 

## 2015-03-01 NOTE — Progress Notes (Signed)
   Subjective:    Patient ID: [name], female    DOB: 08-Mar-1936, 79 y.o.   MRN: [id number]  HPI Patient here for medical follow-up.  Type 2 diabetes. A1c 6.9% last fall. She is currently not treated with any medications. She is diligent regarding diet. She states active with gardening. No symptoms of polyuria or polydipsia  Hypertension treated with HCTZ. Blood pressure stable. No dizziness. She takes simvastatin for hyperlipidemia. Lipids are stable last fall. No recent chest pains.  History of kidney stones but no recent flank pain or dysuria.  Past Medical History  Diagnosis Date  . DEGENERATIVE JOINT DISEASE 08/25/2010  . HYPERLIPIDEMIA 03/08/2009  . HYPERTENSION 03/08/2009  . PREDIABETES 08/23/2009  . Obesity   . Renal calculi    Past Surgical History  Procedure Laterality Date  . Tonsillectomy      reports that she has never smoked. She does not have any smokeless tobacco history on file. She reports that she does not drink alcohol or use illicit drugs. family history includes Diabetes in her father. No Known Allergies    Review of Systems  Constitutional: Negative for fatigue.  Eyes: Negative for visual disturbance.  Respiratory: Negative for cough, chest tightness, shortness of breath and wheezing.   Cardiovascular: Negative for chest pain, palpitations and leg swelling.  Neurological: Negative for dizziness, seizures, syncope, weakness, light-headedness and headaches.       Objective:   Physical Exam  Constitutional: She appears well-developed and well-nourished.  Neck: Neck supple.  Cardiovascular: Normal rate and regular rhythm.  Exam reveals no gallop.   Murmur heard. Pulmonary/Chest: Effort normal and breath sounds normal. No respiratory distress. She has no wheezes. She has no rales.  Musculoskeletal: She exhibits no edema.  Lymphadenopathy:    She has no cervical adenopathy.          Assessment & Plan:  #1 hypertension. Stable and at goal.  Continue HCTZ #2 type 2 diabetes. Diet controlled. Recheck A1c #3 hyperlipidemia. Plan check lipids at follow-up in 6 months. Continue simvastatin

## 2015-07-05 DIAGNOSIS — H25099 Other age-related incipient cataract, unspecified eye: Secondary | ICD-10-CM | POA: Diagnosis not present

## 2015-07-05 DIAGNOSIS — H524 Presbyopia: Secondary | ICD-10-CM | POA: Diagnosis not present

## 2015-07-15 ENCOUNTER — Other Ambulatory Visit: Payer: Self-pay | Admitting: Family Medicine

## 2015-08-19 ENCOUNTER — Other Ambulatory Visit: Payer: Self-pay | Admitting: Family Medicine

## 2015-08-30 ENCOUNTER — Encounter: Payer: Self-pay | Admitting: Family Medicine

## 2015-08-30 ENCOUNTER — Ambulatory Visit (INDEPENDENT_AMBULATORY_CARE_PROVIDER_SITE_OTHER): Payer: Medicare Other | Admitting: Family Medicine

## 2015-08-30 VITALS — BP 120/80 | HR 80 | Temp 98.1°F | Wt 183.0 lb

## 2015-08-30 DIAGNOSIS — Z23 Encounter for immunization: Secondary | ICD-10-CM

## 2015-08-30 DIAGNOSIS — E785 Hyperlipidemia, unspecified: Secondary | ICD-10-CM

## 2015-08-30 DIAGNOSIS — E119 Type 2 diabetes mellitus without complications: Secondary | ICD-10-CM

## 2015-08-30 DIAGNOSIS — I10 Essential (primary) hypertension: Secondary | ICD-10-CM

## 2015-08-30 DIAGNOSIS — N200 Calculus of kidney: Secondary | ICD-10-CM | POA: Diagnosis not present

## 2015-08-30 LAB — LIPID PANEL
CHOL/HDL RATIO: 3
Cholesterol: 181 mg/dL (ref 0–200)
HDL: 64 mg/dL (ref >39.00)
LDL CALC: 98 mg/dL (ref 0–99)
NonHDL: 117.13
TRIGLYCERIDES: 94 mg/dL (ref 0.0–149.0)
VLDL: 18.8 mg/dL (ref 0.0–40.0)

## 2015-08-30 LAB — BASIC METABOLIC PANEL
BUN: 13 mg/dL (ref 6–23)
CALCIUM: 10.1 mg/dL (ref 8.4–10.5)
CO2: 28 mEq/L (ref 19–32)
CREATININE: 0.76 mg/dL (ref 0.40–1.20)
Chloride: 101 mEq/L (ref 96–112)
GFR: 94.7 mL/min (ref >60.00)
Glucose, Bld: 130 mg/dL — ABNORMAL HIGH (ref 70–99)
Potassium: 3.7 mEq/L (ref 3.5–5.1)
Sodium: 136 mEq/L (ref 135–145)

## 2015-08-30 LAB — HEPATIC FUNCTION PANEL
ALT: 23 U/L (ref 0–35)
AST: 26 U/L (ref 0–37)
Albumin: 4.2 g/dL (ref 3.5–5.2)
Alkaline Phosphatase: 96 U/L (ref 39–117)
BILIRUBIN DIRECT: 0.2 mg/dL (ref 0.0–0.3)
Total Bilirubin: 1.2 mg/dL (ref 0.2–1.2)
Total Protein: 8 g/dL (ref 6.0–8.3)

## 2015-08-30 LAB — HEMOGLOBIN A1C: Hgb A1c MFr Bld: 7.1 % — ABNORMAL HIGH (ref 4.6–6.5)

## 2015-08-30 NOTE — Progress Notes (Signed)
Pre visit review using our clinic review tool, if applicable. No additional management support is needed unless otherwise documented below in the visit note. 

## 2015-08-30 NOTE — Progress Notes (Signed)
   Subjective:    Patient ID: [name], female    DOB: [birth year], 79 y.o.   MRN: [id number]  HPI Patient seen for medical follow-up  Recurrent kidney stones. For several years [place] [facility] [place] He recently retired. She is requesting [place] to someone in [place]. No recent issues with any kidney stones. She apparently was getting yearly renal ultrasounds  Type 2 diabetes. Diet controlled. A1c is been consistently well controlled. No polyuria or polydipsia. Managed by diet  Hypertension. Treated with HCTZ. No dizziness. No chest pains. No exertional dyspnea. No recent edema issues. Also has hyperlipidemia treated with simvastatin. Due for repeat lipids. No myalgias. Needs flu vaccine.  Past Medical History  Diagnosis Date  . DEGENERATIVE JOINT DISEASE 08/25/2010  . HYPERLIPIDEMIA 03/08/2009  . HYPERTENSION 03/08/2009  . PREDIABETES 08/23/2009  . Obesity   . Renal calculi    Past Surgical History  Procedure Laterality Date  . Tonsillectomy      reports that she has never smoked. She does not have any smokeless tobacco history on file. She reports that she does not drink alcohol or use illicit drugs. family history includes Diabetes in her father. No Known Allergies    Review of Systems  Constitutional: Negative for appetite change, fatigue and unexpected weight change.  Eyes: Negative for visual disturbance.  Respiratory: Negative for cough, chest tightness, shortness of breath and wheezing.   Cardiovascular: Negative for chest pain, palpitations and leg swelling.  Endocrine: Negative for polydipsia and polyuria.  Musculoskeletal: Positive for arthralgias.  Neurological: Negative for dizziness, seizures, syncope, weakness, light-headedness and headaches.       Objective:   Physical Exam  Constitutional: She is oriented to person, place, and time. She appears well-developed and well-nourished. No distress.  Neck: Neck supple.  No thyromegaly present.  Cardiovascular: Normal rate and regular rhythm.   Pulmonary/Chest: Effort normal and breath sounds normal. No respiratory distress. She has no wheezes. She has no rales.  Musculoskeletal: She exhibits no edema.  Neurological: She is alert and oriented to person, place, and time.  Psychiatric: She has a normal mood and affect. Her behavior is normal.          Assessment & Plan:  #1 type 2 diabetes. Diet controlled. Recheck A1c. Consider low-dose metformin if A1c increasing #2 hypertension stable and at goal #3 hyperlipidemia. Check lipid and hepatic panel #4 history of recurrent kidney stones. Set up referral to urologist #5 health maintenance. Flu vaccine given

## 2015-10-06 ENCOUNTER — Other Ambulatory Visit: Payer: Self-pay | Admitting: Family Medicine

## 2015-10-17 ENCOUNTER — Other Ambulatory Visit: Payer: Self-pay

## 2015-10-17 DIAGNOSIS — Z1231 Encounter for screening mammogram for malignant neoplasm of breast: Secondary | ICD-10-CM

## 2015-11-04 DIAGNOSIS — N200 Calculus of kidney: Secondary | ICD-10-CM | POA: Diagnosis not present

## 2015-11-04 DIAGNOSIS — Z0000 Encounter for general adult medical examination without abnormal findings: Secondary | ICD-10-CM | POA: Diagnosis not present

## 2015-11-22 ENCOUNTER — Ambulatory Visit
Admission: RE | Admit: 2015-11-22 | Discharge: 2015-11-22 | Disposition: A | Discharge status: Home / Self Care | Payer: Medicare Other | Source: Ambulatory Visit

## 2015-11-22 DIAGNOSIS — Z1231 Encounter for screening mammogram for malignant neoplasm of breast: Secondary | ICD-10-CM

## 2016-02-14 ENCOUNTER — Other Ambulatory Visit: Payer: Self-pay | Admitting: Family Medicine

## 2016-02-21 ENCOUNTER — Ambulatory Visit (INDEPENDENT_AMBULATORY_CARE_PROVIDER_SITE_OTHER): Payer: Medicare Other | Admitting: Family Medicine

## 2016-02-21 VITALS — BP 130/80 | HR 86 | Temp 98.2°F | Wt 187.0 lb

## 2016-02-21 DIAGNOSIS — E119 Type 2 diabetes mellitus without complications: Secondary | ICD-10-CM

## 2016-02-21 DIAGNOSIS — E785 Hyperlipidemia, unspecified: Secondary | ICD-10-CM | POA: Diagnosis not present

## 2016-02-21 DIAGNOSIS — I10 Essential (primary) hypertension: Secondary | ICD-10-CM | POA: Diagnosis not present

## 2016-02-21 DIAGNOSIS — M1990 Unspecified osteoarthritis, unspecified site: Secondary | ICD-10-CM | POA: Diagnosis not present

## 2016-02-21 LAB — POCT GLYCOSYLATED HEMOGLOBIN (HGB A1C): Hemoglobin A1C: 7.1

## 2016-02-21 NOTE — Progress Notes (Signed)
   Subjective:    Patient ID: [name], female    DOB: [birth year], 80 y.o.   MRN: [id number]  HPI Patient for medical follow-up  Type 2 diabetes which has been diet-controlled. Does not monitor sugars very often. No polyuria or polydipsia. Currently not treated with medications. Did have some weight gain over the winter. She plans to lose some weight soon  Hypertension treated with HCTZ. Electrolytes have been stable consistently. Compliant with therapy  Hyperlipidemia treated with simvastatin. No myalgias. No recent falls.  She has osteoarthritis affecting multiple joints. Requesting handicap parking pass.  Past Medical History  Diagnosis Date  . DEGENERATIVE JOINT DISEASE 08/25/2010  . HYPERLIPIDEMIA 03/08/2009  . HYPERTENSION 03/08/2009  . PREDIABETES 08/23/2009  . Obesity   . Renal calculi    Past Surgical History  Procedure Laterality Date  . Tonsillectomy      reports that she has never smoked. She does not have any smokeless tobacco history on file. She reports that she does not drink alcohol or use illicit drugs. family history includes Diabetes in her father. No Known Allergies    Review of Systems  Constitutional: Negative for appetite change, fatigue and unexpected weight change.  Eyes: Negative for visual disturbance.  Respiratory: Negative for cough, chest tightness, shortness of breath and wheezing.   Cardiovascular: Negative for chest pain, palpitations and leg swelling.  Gastrointestinal: Negative for abdominal pain.  Endocrine: Negative for polydipsia and polyuria.  Musculoskeletal: Positive for arthralgias.  Neurological: Negative for dizziness, seizures, syncope, weakness, light-headedness and headaches.       Objective:   Physical Exam  Constitutional: She is oriented to person, place, and time. She appears well-developed and well-nourished. No distress.  Neck: Neck supple. No thyromegaly present.  Cardiovascular: Normal rate and regular  rhythm.   Murmur heard. Systolic murmur heard over aortic area. This has been previously noted  Pulmonary/Chest: Effort normal and breath sounds normal. No respiratory distress. She has no wheezes. She has no rales.  Musculoskeletal: She exhibits no edema.  Neurological: She is alert and oriented to person, place, and time.          Assessment & Plan:  #1 type 2 diabetes. This has been well controlled with dietary management. Recheck A1c  #2 hypertension stable and at goal. Continue HCTZ. Recheck basic metabolic panel follow-up  #3 dyslipidemia. This is been stable on simvastatin  4 osteoarthritis involving multiple joints. [facility]  Kristian CoveyBruce W Medrith Veillon MD [facility]

## 2016-02-21 NOTE — Progress Notes (Signed)
Pre visit review using our clinic review tool, if applicable. No additional management support is needed unless otherwise documented below in the visit note. 

## 2016-06-08 ENCOUNTER — Other Ambulatory Visit: Payer: Self-pay | Admitting: Family Medicine

## 2016-06-10 NOTE — Telephone Encounter (Signed)
Rx refill sent to pharmacy. 

## 2016-06-15 ENCOUNTER — Other Ambulatory Visit: Payer: Self-pay

## 2016-07-09 DIAGNOSIS — E119 Type 2 diabetes mellitus without complications: Secondary | ICD-10-CM | POA: Diagnosis not present

## 2016-07-09 DIAGNOSIS — H524 Presbyopia: Secondary | ICD-10-CM | POA: Diagnosis not present

## 2016-07-09 LAB — HM DIABETES EYE EXAM

## 2016-07-22 ENCOUNTER — Encounter: Payer: Self-pay | Admitting: Family Medicine

## 2016-08-22 ENCOUNTER — Other Ambulatory Visit: Payer: Self-pay | Admitting: Family Medicine

## 2016-08-24 ENCOUNTER — Ambulatory Visit (INDEPENDENT_AMBULATORY_CARE_PROVIDER_SITE_OTHER): Payer: Medicare Other | Admitting: Family Medicine

## 2016-08-24 VITALS — BP 150/70 | HR 89 | Temp 97.9°F | Ht 61.0 in | Wt 185.7 lb

## 2016-08-24 DIAGNOSIS — I10 Essential (primary) hypertension: Secondary | ICD-10-CM

## 2016-08-24 DIAGNOSIS — Z23 Encounter for immunization: Secondary | ICD-10-CM

## 2016-08-24 DIAGNOSIS — E119 Type 2 diabetes mellitus without complications: Secondary | ICD-10-CM

## 2016-08-24 DIAGNOSIS — E785 Hyperlipidemia, unspecified: Secondary | ICD-10-CM | POA: Diagnosis not present

## 2016-08-24 LAB — BASIC METABOLIC PANEL
BUN: 10 mg/dL (ref 6–23)
CHLORIDE: 102 meq/L (ref 96–112)
CO2: 30 meq/L (ref 19–32)
CREATININE: 0.75 mg/dL (ref 0.40–1.20)
Calcium: 10.4 mg/dL (ref 8.4–10.5)
GFR: 95.92 mL/min (ref >60.00)
Glucose, Bld: 136 mg/dL — ABNORMAL HIGH (ref 70–99)
POTASSIUM: 4 meq/L (ref 3.5–5.1)
Sodium: 138 mEq/L (ref 135–145)

## 2016-08-24 LAB — LIPID PANEL
CHOL/HDL RATIO: 3
Cholesterol: 173 mg/dL (ref 0–200)
HDL: 49.8 mg/dL (ref >39.00)
LDL CALC: 101 mg/dL — AB (ref 0–99)
NonHDL: 123.63
TRIGLYCERIDES: 115 mg/dL (ref 0.0–149.0)
VLDL: 23 mg/dL (ref 0.0–40.0)

## 2016-08-24 LAB — HEMOGLOBIN A1C: HEMOGLOBIN A1C: 7.7 % — AB (ref 4.6–6.5)

## 2016-08-24 LAB — MICROALBUMIN / CREATININE URINE RATIO
CREATININE, U: 37.1 mg/dL
Microalb Creat Ratio: 1.9 mg/g (ref 0.0–30.0)

## 2016-08-24 LAB — HEPATIC FUNCTION PANEL
ALT: 15 U/L (ref 0–35)
AST: 18 U/L (ref 0–37)
Albumin: 4.1 g/dL (ref 3.5–5.2)
Alkaline Phosphatase: 84 U/L (ref 39–117)
BILIRUBIN DIRECT: 0.1 mg/dL (ref 0.0–0.3)
BILIRUBIN TOTAL: 0.8 mg/dL (ref 0.2–1.2)
TOTAL PROTEIN: 7.6 g/dL (ref 6.0–8.3)

## 2016-08-24 MED ORDER — SIMVASTATIN 40 MG PO TABS: 40.0000 mg | ORAL_TABLET | Freq: Every day | ORAL | 3 refills | Status: DC

## 2016-08-24 NOTE — Progress Notes (Signed)
Pre visit review using our clinic review tool, if applicable. No additional management support is needed unless otherwise documented below in the visit note. 

## 2016-08-24 NOTE — Progress Notes (Signed)
Subjective:     Patient ID: Joyce MinkElla W Benson, female   DOB: [birth year], 80 y.o.   MRN: [id number]  HPI Patient seen for medical follow-up. She has history of kidney stones, type 2 diabetes, hyperlipidemia, hypertension. Generally doing well. No recent falls. No depression issues. She stays very active with her church. Her medications are reviewed. Her diabetes has been fairly well controlled without medication thus far. She's not had any recent polyuria or polydipsia. Does not monitor blood pressure at home. Does not monitor blood sugars regularly. No recent chest pains. No dizziness.  Needs flu vaccine. Other immunizations are up-to-date. Medications reviewed and compliant with all. Denies any side effects.  Past Medical History:  Diagnosis Date  . DEGENERATIVE JOINT DISEASE 08/25/2010  . HYPERLIPIDEMIA 03/08/2009  . HYPERTENSION 03/08/2009  . Obesity   . PREDIABETES 08/23/2009  . Renal calculi    Past Surgical History:  Procedure Laterality Date  . TONSILLECTOMY      reports that she has never smoked. She does not have any smokeless tobacco history on file. She reports that she does not drink alcohol or use drugs. family history includes Diabetes in her father. No Known Allergies   Review of Systems  Constitutional: Negative for appetite change, fatigue and unexpected weight change.  Eyes: Negative for visual disturbance.  Respiratory: Negative for cough, chest tightness, shortness of breath and wheezing.   Cardiovascular: Negative for chest pain, palpitations and leg swelling.  Endocrine: Negative for polydipsia and polyuria.  Musculoskeletal: Positive for arthralgias.  Neurological: Negative for dizziness, seizures, syncope, weakness, light-headedness and headaches.  Psychiatric/Behavioral: Negative for dysphoric mood.       Objective:   Physical Exam  Constitutional: She appears well-developed and well-nourished.  Eyes: Pupils are equal, round, and reactive to light.  Neck:  Neck supple. No JVD present. No thyromegaly present.  Cardiovascular: Normal rate and regular rhythm.  Exam reveals no gallop.   Pulmonary/Chest: Effort normal and breath sounds normal. No respiratory distress. She has no wheezes. She has no rales.  Musculoskeletal: She exhibits no edema.  Neurological: She is alert.  Skin:  Feet reveal no skin lesions. Good distal foot pulses. Good capillary refill. No calluses. Normal sensation with monofilament testing        Assessment:     #1 hypertension. She has slightly elevated reading today which is somewhat atypical for her  #2 type 2 diabetes which has been well controlled  #3 hyperlipidemia    Plan:     -Obtain labs today with lipid panel, hepatic panel, basic metabolic panel -Flu vaccine given -Follow-up in one month to reassess blood pressure.  If blood pressure still up at that point consider additional medication  Kristian CoveyBruce W Burchette MD [facility]

## 2016-08-26 ENCOUNTER — Other Ambulatory Visit: Payer: Self-pay

## 2016-08-26 MED ORDER — METFORMIN HCL 500 MG PO TABS: 500.0000 mg | ORAL_TABLET | Freq: Two times a day (BID) | ORAL | 3 refills | Status: DC

## 2016-09-23 ENCOUNTER — Ambulatory Visit (INDEPENDENT_AMBULATORY_CARE_PROVIDER_SITE_OTHER): Payer: Medicare Other | Admitting: Family Medicine

## 2016-09-23 ENCOUNTER — Encounter: Payer: Self-pay | Admitting: Family Medicine

## 2016-09-23 VITALS — BP 136/70 | HR 77 | Temp 97.7°F | Wt 182.6 lb

## 2016-09-23 DIAGNOSIS — I10 Essential (primary) hypertension: Secondary | ICD-10-CM

## 2016-09-23 DIAGNOSIS — E119 Type 2 diabetes mellitus without complications: Secondary | ICD-10-CM | POA: Diagnosis not present

## 2016-09-23 NOTE — Patient Instructions (Signed)
GOAL EARLY MORNING SUGAR < 130 (ie 70-130) LET'S PLAN ON FOLLOW UP A1C IN ABOUT 3 MONTHS.

## 2016-09-23 NOTE — Progress Notes (Signed)
Subjective:     Patient ID: [name], female   DOB: 09-19-1936, 80 y.o.   MRN: [id number]  HPI Patient seen for follow-up regarding recent elevated blood pressure and elevated A1c. Generally, she's had good blood pressure control but recent visit 150/70. She takes only low-dose HCTZ for blood pressure. She's had no headaches. No dizziness.  Type 2 diabetes. Recent A1c had increased to 7.7%. We added metformin 5 her milligrams twice a day. She is taken without side effects. Recent fasting blood sugars usually range around 100-115. No polyuria or polydipsia.  Past Medical History:  Diagnosis Date  . DEGENERATIVE JOINT DISEASE 08/25/2010  . HYPERLIPIDEMIA 03/08/2009  . HYPERTENSION 03/08/2009  . Obesity   . PREDIABETES 08/23/2009  . Renal calculi    Past Surgical History:  Procedure Laterality Date  . TONSILLECTOMY      reports that she has never smoked. She has never used smokeless tobacco. She reports that she does not drink alcohol or use drugs. family history includes Diabetes in her father. No Known Allergies   Review of Systems  Constitutional: Negative for fatigue and unexpected weight change.  Eyes: Negative for visual disturbance.  Respiratory: Negative for cough, chest tightness, shortness of breath and wheezing.   Cardiovascular: Negative for chest pain, palpitations and leg swelling.  Endocrine: Negative for polydipsia and polyuria.  Neurological: Negative for dizziness, seizures, syncope, weakness, light-headedness and headaches.       Objective:   Physical Exam  Constitutional: She appears well-developed and well-nourished.  Neck: Neck supple.  Cardiovascular: Normal rate and regular rhythm.   Murmur heard. Pulmonary/Chest: Effort normal and breath sounds normal. No respiratory distress. She has no wheezes. She has no rales.  Musculoskeletal: She exhibits no edema.       Assessment:     #1 hypertension. Improved readings today and near goal  #2 type 2  diabetes with recent A1c 7.7%    Plan:     -Continue metformin -Continue current medication regimen -Routine follow-up in 3 months and repeat A1c then  Kristian CoveyBruce W Burchette MD [facility]

## 2016-09-23 NOTE — Progress Notes (Signed)
Pre visit review using our clinic review tool, if applicable. No additional management support is needed unless otherwise documented below in the visit note. 

## 2016-09-28 ENCOUNTER — Other Ambulatory Visit: Payer: Self-pay | Admitting: Family Medicine

## 2016-10-16 ENCOUNTER — Other Ambulatory Visit: Payer: Self-pay | Admitting: Family Medicine

## 2016-10-16 DIAGNOSIS — Z1231 Encounter for screening mammogram for malignant neoplasm of breast: Secondary | ICD-10-CM

## 2016-10-22 ENCOUNTER — Telehealth: Payer: Self-pay | Admitting: Family Medicine

## 2016-10-22 MED ORDER — ACCU-CHEK SOFTCLIX LANCETS MISC: 3 refills | Status: AC

## 2016-10-22 NOTE — Telephone Encounter (Signed)
Pt need new Rx for Accu Check lancets  Pharm:  [facility] [address].   Pt state that she only has 11 left she is a Type 2 diabetic.

## 2016-10-22 NOTE — Telephone Encounter (Signed)
Diabetic supply sent in for patient.

## 2016-11-19 ENCOUNTER — Other Ambulatory Visit: Payer: Self-pay | Admitting: Family Medicine

## 2016-11-23 ENCOUNTER — Ambulatory Visit
Admission: RE | Admit: 2016-11-23 | Discharge: 2016-11-23 | Disposition: A | Discharge status: Home / Self Care | Payer: Medicare Other | Source: Ambulatory Visit | Attending: Family Medicine | Admitting: Family Medicine

## 2016-11-23 DIAGNOSIS — Z1231 Encounter for screening mammogram for malignant neoplasm of breast: Secondary | ICD-10-CM

## 2016-12-22 ENCOUNTER — Encounter: Payer: Self-pay | Admitting: Family Medicine

## 2016-12-22 ENCOUNTER — Ambulatory Visit (INDEPENDENT_AMBULATORY_CARE_PROVIDER_SITE_OTHER): Payer: Medicare Other | Admitting: Family Medicine

## 2016-12-22 VITALS — BP 130/70 | Temp 97.9°F | Wt 184.2 lb

## 2016-12-22 DIAGNOSIS — I10 Essential (primary) hypertension: Secondary | ICD-10-CM | POA: Diagnosis not present

## 2016-12-22 DIAGNOSIS — E119 Type 2 diabetes mellitus without complications: Secondary | ICD-10-CM

## 2016-12-22 DIAGNOSIS — E785 Hyperlipidemia, unspecified: Secondary | ICD-10-CM | POA: Diagnosis not present

## 2016-12-22 LAB — POCT GLYCOSYLATED HEMOGLOBIN (HGB A1C): Hemoglobin A1C: 6.6

## 2016-12-22 NOTE — Progress Notes (Signed)
Pre visit review using our clinic review tool, if applicable. No additional management support is needed unless otherwise documented below in the visit note. 

## 2016-12-22 NOTE — Progress Notes (Signed)
Subjective:     Patient ID: [name], female   DOB: 01/24/1936, 81 y.o.   MRN: [id number]  HPI Follow-up type 2 diabetes. A few months ago had A1C 7.7%. We started metformin 500 mg twice daily which she is tolerating without side effects. She states at home readings have improved since starting this. We planned on follow-up A1c today.  Hypertension which has been treated with HCTZ 12.5 mg daily. Recent electrolytes stable. She has dyslipidemia on simvastatin. Denies any recent dyspnea. No chest pains. She stays active with her church and is involved with 3 choirs.  Past Medical History:  Diagnosis Date  . DEGENERATIVE JOINT DISEASE 08/25/2010  . HYPERLIPIDEMIA 03/08/2009  . HYPERTENSION 03/08/2009  . Obesity   . PREDIABETES 08/23/2009  . Renal calculi    Past Surgical History:  Procedure Laterality Date  . TONSILLECTOMY      reports that she has never smoked. She has never used smokeless tobacco. She reports that she does not drink alcohol or use drugs. family history includes Diabetes in her father. No Known Allergies   Review of Systems  Constitutional: Negative for fatigue.  Eyes: Negative for visual disturbance.  Respiratory: Negative for cough, chest tightness, shortness of breath and wheezing.   Cardiovascular: Negative for chest pain, palpitations and leg swelling.  Endocrine: Negative for polydipsia and polyuria.  Neurological: Negative for dizziness, seizures, syncope, weakness, light-headedness and headaches.       Objective:   Physical Exam  Constitutional: She appears well-developed and well-nourished.  Cardiovascular: Normal rate and regular rhythm.   Murmur heard. Soft 1/6 systolic murmur right upper sternal border  Pulmonary/Chest: Effort normal and breath sounds normal. No respiratory distress. She has no wheezes. She has no rales.  Musculoskeletal: She exhibits no edema.       Assessment:     #1 type 2 diabetes. History of recent suboptimal control  with initiation recently of metformin  #2 hypertension stable and at goal  #3 dyslipidemia on simvastatin    Plan:     -Recheck A1c=6.6%.   -continue with current medications. -routine follow up in 6 months.  Kristian CoveyBruce W Onya Eutsler MD [facility]

## 2017-01-05 ENCOUNTER — Telehealth: Payer: Self-pay

## 2017-01-05 NOTE — Telephone Encounter (Signed)
Call to [name] Just seen by Dr. Caryl NeverBurchette but asked to come in for AWV LvM with direct line and can schedule in Sept during her apt  (apt 9/7 at 11am)

## 2017-02-08 ENCOUNTER — Telehealth: Payer: Self-pay | Admitting: Family Medicine

## 2017-03-02 NOTE — Telephone Encounter (Signed)
Called to see if pt wanted to schedule awv - left message ( will be here 9/7 - awv while she is here? )

## 2017-06-11 ENCOUNTER — Other Ambulatory Visit: Payer: Self-pay | Admitting: Family Medicine

## 2017-06-25 ENCOUNTER — Ambulatory Visit (INDEPENDENT_AMBULATORY_CARE_PROVIDER_SITE_OTHER): Payer: Medicare Other | Admitting: Family Medicine

## 2017-06-25 ENCOUNTER — Encounter: Payer: Self-pay | Admitting: Family Medicine

## 2017-06-25 VITALS — BP 142/78 | HR 98 | Temp 97.5°F | Ht 61.0 in | Wt 189.9 lb

## 2017-06-25 DIAGNOSIS — H6121 Impacted cerumen, right ear: Secondary | ICD-10-CM

## 2017-06-25 DIAGNOSIS — I10 Essential (primary) hypertension: Secondary | ICD-10-CM | POA: Diagnosis not present

## 2017-06-25 DIAGNOSIS — E119 Type 2 diabetes mellitus without complications: Secondary | ICD-10-CM

## 2017-06-25 DIAGNOSIS — E785 Hyperlipidemia, unspecified: Secondary | ICD-10-CM

## 2017-06-25 DIAGNOSIS — Z0000 Encounter for general adult medical examination without abnormal findings: Secondary | ICD-10-CM

## 2017-06-25 DIAGNOSIS — Z23 Encounter for immunization: Secondary | ICD-10-CM

## 2017-06-25 DIAGNOSIS — E2839 Other primary ovarian failure: Secondary | ICD-10-CM

## 2017-06-25 LAB — LIPID PANEL
CHOL/HDL RATIO: 3
Cholesterol: 193 mg/dL (ref 0–200)
HDL: 58.3 mg/dL (ref >39.00)
LDL Cholesterol: 113 mg/dL — ABNORMAL HIGH (ref 0–99)
NONHDL: 134.32
Triglycerides: 105 mg/dL (ref 0.0–149.0)
VLDL: 21 mg/dL (ref 0.0–40.0)

## 2017-06-25 LAB — HEPATIC FUNCTION PANEL
ALK PHOS: 71 U/L (ref 39–117)
ALT: 13 U/L (ref 0–35)
AST: 15 U/L (ref 0–37)
Albumin: 4.4 g/dL (ref 3.5–5.2)
BILIRUBIN DIRECT: 0.1 mg/dL (ref 0.0–0.3)
Total Bilirubin: 0.5 mg/dL (ref 0.2–1.2)
Total Protein: 7.7 g/dL (ref 6.0–8.3)

## 2017-06-25 LAB — BASIC METABOLIC PANEL
BUN: 13 mg/dL (ref 6–23)
CALCIUM: 10.5 mg/dL (ref 8.4–10.5)
CO2: 29 meq/L (ref 19–32)
Chloride: 102 mEq/L (ref 96–112)
Creatinine, Ser: 0.75 mg/dL (ref 0.40–1.20)
GFR: 95.71 mL/min (ref >60.00)
GLUCOSE: 112 mg/dL — AB (ref 70–99)
Potassium: 4.4 mEq/L (ref 3.5–5.1)
SODIUM: 139 meq/L (ref 135–145)

## 2017-06-25 LAB — POCT GLYCOSYLATED HEMOGLOBIN (HGB A1C): HEMOGLOBIN A1C: 6.8

## 2017-06-25 NOTE — Progress Notes (Addendum)
Subjective:   [name] is a 81 y.o. female who presents for Medicare Annual (Subsequent) preventive examination.  The Patient was informed that the wellness visit is to identify future health risk and educate and initiate measures that can reduce risk for increased disease through the lifespan.    Annual Wellness Assessment  Reports health as Ok   Preventive Screening -Counseling & Management  Medicare Annual Preventive Care Visit - Subsequent Last OV today with Dr. Caryl NeverBurchette    VS reviewed;   Diet very meticulous diet but very tired  At the same time, wants to keep her A1c down  Educated on A1c which is no 6.8 Encouraged variety Encouraged portions Encouraged eating smart  Count carbs with journal  Given list of foods to avoid    BMI 35   Exercise states she walks off and on during the day x 30 minutes; uses a cane   Safety; states she is independent; does have a cousin in the area; brother lives in [place] and sister out of state as well. States she does wear a emergency alert band at hs and when showering etc. No failures of independent living States she     Cardiac Risk Factors Addressed Hyperlipidemia - last year trig 115; LDL 101;  Ratio is 3  Diabetes A1c 6,8 Obesity 35    Patient Care Team: Joyce CoveyBurchette, [name], [name] as PCP - General Assessed for additional providers  Immunization History  Administered Date(s) Administered  . Influenza Split 08/27/2011, 09/01/2012  . Influenza Whole 08/23/2009, 08/25/2010  . Influenza, High Dose Seasonal PF 09/01/2013, 08/30/2015, 08/24/2016, 06/25/2017  . Influenza-Unspecified 08/04/2014  . Pneumococcal Conjugate-13 03/01/2014  . Pneumococcal Polysaccharide-23 10/20/2003   Required Immunizations needed today  Screening test up to date or reviewed for plan of completion Health Maintenance Due  Topic Date Due  . TETANUS/TDAP  11/05/1956  . DEXA SCAN  11/05/2002  . INFLUENZA VACCINE  05/19/2017  . HEMOGLOBIN A1C   06/24/2017   Agreed to take her tetanus at the pharmacy due to cost  Agreed to dexa scan and will schedule at the [facility] center where she gets her Mammogram  States she was given flu vaccine today   A1c to be checked today    Cardiac Risk Factors include: advanced age (>755men, 102>65 women);obesity (BMI >30kg/m2);sedentary lifestyle     Objective:     Vitals: BP (!) 142/78 (BP Location: Left Arm, Patient Position: Sitting)   Pulse 98   Temp (!) 97.5 F (36.4 C) (Oral)   Ht 5\' 1"  (1.549 m)   Wt 189 lb 14.4 oz (86.1 kg)   SpO2 96%   BMI 35.88 kg/m   Body mass index is 35.88 kg/m.   Tobacco History  Smoking Status  . Never Smoker  Smokeless Tobacco  . Never Used     Counseling given: Yes   Past Medical History:  Diagnosis Date  . DEGENERATIVE JOINT DISEASE 08/25/2010  . HYPERLIPIDEMIA 03/08/2009  . HYPERTENSION 03/08/2009  . Obesity   . PREDIABETES 08/23/2009  . Renal calculi    Past Surgical History:  Procedure Laterality Date  . TONSILLECTOMY     Family History  Problem Relation Age of Onset  . Diabetes Father    History  Sexual Activity  . Sexual activity: Not on file    Outpatient Encounter Prescriptions as of 06/25/2017  Medication Sig  . ACCU-CHEK AVIVA PLUS test strip USE AS INSTRUCTED  . ACCU-CHEK SOFTCLIX LANCETS lancets Pt checks blood sugars  twice per day. DX: E11.9  . aspirin 81 MG tablet Take 81 mg by mouth daily.    . hydrochlorothiazide (HYDRODIURIL) 25 MG tablet TAKE 1/2 TABLET BY MOUTH EVERY DAY  . Ketotifen Fumarate (ZADITOR OP) Apply 0.035 % to eye 2 (two) times daily.   . metFORMIN (GLUCOPHAGE) 500 MG tablet TAKE 1 TABLET(500 MG) BY MOUTH TWICE DAILY WITH A MEAL  . multivitamin-iron-minerals-folic acid (CENTRUM) chewable tablet Chew 1 tablet by mouth daily.  . simvastatin (ZOCOR) 40 MG tablet Take 1 tablet (40 mg total) by mouth daily.  . [DISCONTINUED] Multiple Vitamin (MULTIVITAMIN) tablet Take 1 tablet by mouth daily.     No  facility-administered encounter medications on file as of 06/25/2017.     Activities of Daily Living In your present state of health, do you have any difficulty performing the following activities: 06/25/2017  Hearing? N  Vision? N  Difficulty concentrating or making decisions? (No Data)  Comment lives independently without issues  Walking or climbing stairs? Y  Dressing or bathing? N  Doing errands, shopping? N  Preparing Food and eating ? N  Using the Toilet? N  Do you have problems with loss of bowel control? N  Managing your Medications? N  Managing your Finances? N  Housekeeping or managing your Housekeeping? N  Some recent data might be hidden    Patient Care Team: Joyce Covey, [name] as PCP - General    Assessment:     Exercise Activities and Dietary recommendations Current Exercise Habits: Home exercise routine, Type of exercise: walking, Time (Minutes): 30, Intensity: Mild  Goals    . patient          Do better to control type 2 DM  Today it is 6.8         Fall Risk Fall Risk  06/25/2017 08/24/2016 06/15/2016 03/03/2015 03/01/2015  Falls in the past year? No No No No No  Comment - - Emmi Telephone Survey: data to providers prior to load - -   Depression Screen PHQ 2/9 Scores 06/25/2017 08/24/2016 03/03/2015 03/01/2015  PHQ - 2 Score 0 0 0 0     Cognitive Function MMSE - Mini Mental State Exam 06/25/2017  Not completed: Refused  Declined today but no failures of independent living States she has this done recently by insurance hh nurse. States her memory was good. They were to send a report to the office        Immunization History  Administered Date(s) Administered  . Influenza Split 08/27/2011, 09/01/2012  . Influenza Whole 08/23/2009, 08/25/2010  . Influenza, High Dose Seasonal PF 09/01/2013, 08/30/2015, 08/24/2016, 06/25/2017  . Influenza-Unspecified 08/04/2014  . Pneumococcal Conjugate-13 03/01/2014  . Pneumococcal Polysaccharide-23 10/20/2003    Screening Tests Health Maintenance  Topic Date Due  . TETANUS/TDAP  11/05/1956  . DEXA SCAN  11/05/2002  . INFLUENZA VACCINE  05/19/2017  . HEMOGLOBIN A1C  06/24/2017  . OPHTHALMOLOGY EXAM  07/09/2017  . URINE MICROALBUMIN  08/24/2017  . FOOT EXAM  06/25/2018  . PNA vac Low Risk Adult  Completed      Plan:     PCP Notes Note: abbreviated wellness; as she just had nurse come out from [facility]; no report available today. Did take MMSE but did not want to repeat. States she did well  Health Maintenance States she had her flu vaccine today Agreed to take the tdap at the pharmacy  dexa scan ordered    Abnormal Screens  A1c was good at 6.8; was  over 7 in 2016 and 2017 C/o about diet and states she really doesn't enjoy eating anymore. Discussed changes and variety of foods she can try; to eat occasional favorites in meat and sweets but to only eat a small slice or portion and can monitor the scale. Had to go today but is willing to make an apt to come in and discuss DM and plan.  Referrals  Declined the diabetes center  Patient concerns; Losing weight   Nurse Concerns; As noted   Next PCP apt is  March 8th   I have personally reviewed and noted the following in the patient's chart:   . Medical and social history . Use of alcohol, tobacco or illicit drugs  . Current medications and supplements . Functional ability and status . Nutritional status . Physical activity . Advanced directives . List of other physicians . Hospitalizations, surgeries, and ER visits in previous 12 months . Vitals . Screenings to include cognitive, depression, and falls . Referrals and appointments  In addition, I have reviewed and discussed with patient certain preventive protocols, quality metrics, and best practice recommendations. A written personalized care plan for preventive services as well as general preventive health recommendations were provided to patient.     Alfreida Steffenhagen,  RN  06/25/2017  Agree with assessment as above.  Joyce Covey [name] [facility] at [facility]

## 2017-06-25 NOTE — Progress Notes (Addendum)
Subjective:     Patient ID: [name], female   DOB: 1936/09/09, 81 y.o.   MRN: [id number]  HPI Patient seen for routine medical follow-up. Her chronic problems could history of obesity, type 2 diabetes, hyperlipidemia, kidney stones, hypertension. Medications reviewed. Compliant with all. Blood sugars been stable fasting. A1c today 6.8%. She remains on metformin for diabetes. No neuropathy symptoms.  Denies any chest pains. No dyspnea. Remains on simvastatin for hyperlipidemia. No myalgias.  Needs flu vaccine. Other immunizations up-to-date.  Past Medical History:  Diagnosis Date  . DEGENERATIVE JOINT DISEASE 08/25/2010  . HYPERLIPIDEMIA 03/08/2009  . HYPERTENSION 03/08/2009  . Obesity   . PREDIABETES 08/23/2009  . Renal calculi    Past Surgical History:  Procedure Laterality Date  . TONSILLECTOMY      reports that she has never smoked. She has never used smokeless tobacco. She reports that she does not drink alcohol or use drugs. family history includes Diabetes in her father. No Known Allergies   Review of Systems  Constitutional: Negative for fatigue.  Eyes: Negative for visual disturbance.  Respiratory: Negative for cough, chest tightness, shortness of breath and wheezing.   Cardiovascular: Negative for chest pain, palpitations and leg swelling.  Neurological: Negative for dizziness, seizures, syncope, weakness, light-headedness and headaches.       Objective:   Physical Exam  Constitutional: She appears well-developed and well-nourished.  HENT:  Patient has moderate cerumen right canal. This was removed with curette without difficulty  Eyes: Pupils are equal, round, and reactive to light.  Neck: Neck supple. No JVD present. No thyromegaly present.  Cardiovascular: Normal rate and regular rhythm.  Exam reveals no gallop.   Pulmonary/Chest: Effort normal and breath sounds normal. No respiratory distress. She has no wheezes. She has no rales.  Musculoskeletal: She  exhibits no edema.  Neurological: She is alert.  Skin:  Feet reveal no skin lesions. Good distal foot pulses. Good capillary refill. No calluses. Normal sensation with monofilament testing        Assessment:     #1 type 2 diabetes. Remains well controlled with A1c 6.8%  #2 hypertension which has been stable by home readings  #3 dyslipidemia  #4 cerumen right canal    Plan:     -Removal of cerumen with curette as above without difficulty -Flu vaccine given -Recheck labs with lipid panel, hepatic panel, basic metabolic panel -Patient scheduled also for Medicare wellness visit today -Routine follow-up in 6 months and sooner as needed  Kristian CoveyBruce W Daine Croker MD [facility]  Addendum to above note on 07-02-17.   Cerumen in right canal was impacted and totally obscuring the eardrum.  Kristian CoveyBruce W Hawk Mones MD [facility]

## 2017-06-25 NOTE — Patient Instructions (Addendum)
[name] , Thank you for taking time to come for your Medicare Wellness Visit. I appreciate your ongoing commitment to your health goals. Please review the following plan we discussed and let me know if I can assist you in the future.   You can exercise a little more but also lose 8 to 10 lbs to reduce you A1c. Try to add variety Check out  online nutrition programs as GumSearch.nl and http://vang.com/; fit55m; Look for foods with "whole" wheat; bran; oatmeal etc Shot at the farmer's markets in season for fresher choices  Watch for "hydrogenated" on the label of oils which are trans-fats.  Watch for "high fructose corn syrup" in snacks, yogurt or ketchup  Meats have less marbling; bright colored fruits and vegetables;  Canned; dump out liquid and wash vegetables. Be mindful of what we are eating  Portion control is essential to a health weight! Sit down; take a break and enjoy your meal; take smaller bites; put the fork down between bites;  It takes 20 minutes to get full; so check in with your fullness cues and stop eating when you start to fill full   A Tetanus is recommended every 10 years. Medicare covers a tetanus if you have a cut or wound; otherwise, there may be a charge. If you had not had a tetanus with pertusses, known as the Tdap, you can take this anytime.   Please make an apt to come in and talk about your diet and options      These are the goals we discussed: Goals    . patient          Do better to control type 2 DM  Today it is 6.8          This is a list of the screening recommended for you and due dates:  Health Maintenance  Topic Date Due  . Tetanus Vaccine  11/[birth year]/1956  . DEXA scan (bone density measurement)  11/05/2002  . Flu Shot  05/19/2017  . Hemoglobin A1C  06/24/2017  . Eye exam for diabetics  07/09/2017  . Urine Protein Check  08/24/2017  . Complete foot exam   06/25/2018  . Pneumonia vaccines  Completed    Meal Ideas & Options:    Breakfast:  ? Whole wheat toast or whole wheat English muffins with peanut butter & hard boiled egg ? Steel cut oats topped with apples & cinnamon and skim milk  ? Fresh fruit: banana, strawberries, melon, berries, peaches  ? Smoothies: strawberries, bananas, greek yogurt, peanut butter ? Low fat greek yogurt with blueberries and granola  ? Egg white omelet with spinach and mushrooms ? Breakfast couscous: whole wheat couscous, apricots, skim milk, cranberries   Sandwiches:  ? Hummus and grilled vegetables (peppers, zucchini, squash) on whole wheat bread   ? Grilled chicken on whole wheat pita with lettuce, tomatoes, cucumbers or tzatziki  ? Tuna salad on whole wheat bread: tuna salad made with greek yogurt, olives, red peppers, capers, green onions ? Garlic rosemary lamb pita: lamb sauted with garlic, rosemary, salt & pepper; add lettuce, cucumber, greek yogurt to pita - flavor with lemon juice and black pepper   Seafood:  ? Mediterranean grilled salmon, seasoned with garlic, basil, parsley, lemon juice and black pepper ? Shrimp, lemon, and spinach whole-grain pasta salad made with low fat greek yogurt  ? Seared scallops with lemon orzo  ? Seared tuna steaks seasoned salt, pepper, coriander topped with tomato mixture of olives, tomatoes, olive  oil, minced garlic, parsley, green onions and cappers   Meats:  ? Herbed greek chicken salad with kalamata olives, cucumber, feta  ? Red bell peppers stuffed with spinach, bulgur, lean ground beef (or lentils) & topped with feta   ? Kebabs: skewers of chicken, tomatoes, onions, zucchini, squash  ? [place] burgers: made with red onions, mint, dill, lemon juice, feta cheese topped with roasted red peppers  Vegetarian ? Cucumber salad: cucumbers, artichoke hearts, celery, red onion, feta cheese, tossed in olive oil & lemon juice  ? Hummus and whole grain pita points with a greek salad (lettuce, tomato, feta, olives, cucumbers, red onion) ? Lentil  soup with celery, carrots made with vegetable broth, garlic, salt and pepper  ? Tabouli salad: parsley, bulgur, mint, scallions, cucumbers, tomato, radishes, lemon juice, olive oil, salt and pepper.  Prevention of falls: Remove rugs or any tripping hazards in the home Use Non slip mats in bathtubs and showers Placing grab bars next to the toilet and or shower Placing handrails on both sides of the stair way Adding extra lighting in the home.   Personal safety issues reviewed:  1. Consider starting a community watch program per [facility] 2.  Changes batteries is smoke detector and/or carbon monoxide detector  3.  If you have firearms; keep them in a safe place 4.  Wear protection when in the sun; Always wear sunscreen or a hat; It is good to have your doctor check your skin annually or review any new areas of concern 5. Driving safety; Keep in the right lane; stay 3 car lengths behind the car in front of you on the highway; look 3 times prior to pulling out; carry your cell phone everywhere you go!    Learn about the Yellow Dot program:  The program allows first responders at your emergency to have access to who your physician is, as well as your medications and medical conditions.  Citizens requesting the Yellow Dot Packages should contact Master Corporal Nunzio Cobbs at the [facility] [phone] for the first week of the program and beginning the week after Easter citizens should contact their Scientist, physiological.    Falls can be the main reason people lose their independence Think before you CLIMB  4 things you can do to prevent falls 1. Begin an exercise program to improve your leg strength and balance 2. Ask the doctor to review medicines for fall risk 3. Get an annual eye check up and update your eye glasses;  (the [employer] still assist with eyewear:  Reviewed for annual vision exam;[facility] assistance for eyewear is coordinated  through [facility]; Please call Cherlyn Labella at [phone] 4. Make your home safer by: Removing clutter and tripping hazzards Putting railing on stairs and adding grab bars to the bathroom  Have good lighting; especially on stairs and at night when getting up to the bathroom   Exercise; including walking can assist with maintaining tone and balance and help prevent falls as you age.   Health Maintenance, Female Adopting a healthy lifestyle and getting preventive care can go a long way to promote health and wellness. Talk with your health care provider about what schedule of regular examinations is right for you. This is a good chance for you to check in with your provider about disease prevention and staying healthy. In between checkups, there are plenty of things you can do on your own. Experts have done a lot of research about which  lifestyle changes and preventive measures are most likely to keep you healthy. Ask your health care provider for more information. Weight and diet Eat a healthy diet  Be sure to include plenty of vegetables, fruits, low-fat dairy products, and lean protein.  Do not eat a lot of foods high in solid fats, added sugars, or salt.  Get regular exercise. This is one of the most important things you can do for your health. ? Most adults should exercise for at least 150 minutes each week. The exercise should increase your heart rate and make you sweat (moderate-intensity exercise). ? Most adults should also do strengthening exercises at least twice a week. This is in addition to the moderate-intensity exercise.  Maintain a healthy weight  Body mass index (BMI) is a measurement that can be used to identify possible weight problems. It estimates body fat based on height and weight. Your health care provider can help determine your BMI and help you achieve or maintain a healthy weight.  For females 40 years of age and older: ? A BMI below 18.5 is considered  underweight. ? A BMI of 18.5 to 24.9 is normal. ? A BMI of 25 to 29.9 is considered overweight. ? A BMI of 30 and above is considered obese.  Watch levels of cholesterol and blood lipids  You should start having your blood tested for lipids and cholesterol at 79 years of age, then have this test every 5 years.  You may need to have your cholesterol levels checked more often if: ? Your lipid or cholesterol levels are high. ? You are older than 79 years of age. ? You are at high risk for heart disease.  Cancer screening Lung Cancer  Lung cancer screening is recommended for adults 46-84 years old who are at high risk for lung cancer because of a history of smoking.  A yearly low-dose CT scan of the lungs is recommended for people who: ? Currently smoke. ? Have quit within the past 15 years. ? Have at least a 30-pack-year history of smoking. A pack year is smoking an average of one pack of cigarettes a day for 1 year.  Yearly screening should continue until it has been 15 years since you quit.  Yearly screening should stop if you develop a health problem that would prevent you from having lung cancer treatment.  Breast Cancer  Practice breast self-awareness. This means understanding how your breasts normally appear and feel.  It also means doing regular breast self-exams. Let your health care provider know about any changes, no matter how small.  If you are in your 20s or 30s, you should have a clinical breast exam (CBE) by a health care provider every 1-3 years as part of a regular health exam.  If you are 54 or older, have a CBE every year. Also consider having a breast X-ray (mammogram) every year.  If you have a family history of breast cancer, talk to your health care provider about genetic screening.  If you are at high risk for breast cancer, talk to your health care provider about having an MRI and a mammogram every year.  Breast cancer gene (BRCA) assessment is  recommended for women who have family members with BRCA-related cancers. BRCA-related cancers include: ? Breast. ? Ovarian. ? Tubal. ? Peritoneal cancers.  Results of the assessment will determine the need for genetic counseling and BRCA1 and BRCA2 testing.  Cervical Cancer Your health care provider may recommend that you be screened regularly  for cancer of the pelvic organs (ovaries, uterus, and vagina). This screening involves a pelvic examination, including checking for microscopic changes to the surface of your cervix (Pap test). You may be encouraged to have this screening done every 3 years, beginning at age 42.  For women ages 64-65, health care providers may recommend pelvic exams and Pap testing every 3 years, or they may recommend the Pap and pelvic exam, combined with testing for human papilloma virus (HPV), every 5 years. Some types of HPV increase your risk of cervical cancer. Testing for HPV may also be done on women of any age with unclear Pap test results.  Other health care providers may not recommend any screening for nonpregnant women who are considered low risk for pelvic cancer and who do not have symptoms. Ask your health care provider if a screening pelvic exam is right for you.  If you have had past treatment for cervical cancer or a condition that could lead to cancer, you need Pap tests and screening for cancer for at least 20 years after your treatment. If Pap tests have been discontinued, your risk factors (such as having a new sexual partner) need to be reassessed to determine if screening should resume. Some women have medical problems that increase the chance of getting cervical cancer. In these cases, your health care provider may recommend more frequent screening and Pap tests.  Colorectal Cancer  This type of cancer can be detected and often prevented.  Routine colorectal cancer screening usually begins at 79 years of age and continues through 79 years of  age.  Your health care provider may recommend screening at an earlier age if you have risk factors for colon cancer.  Your health care provider may also recommend using home test kits to check for hidden blood in the stool.  A small camera at the end of a tube can be used to examine your colon directly (sigmoidoscopy or colonoscopy). This is done to check for the earliest forms of colorectal cancer.  Routine screening usually begins at age 38.  Direct examination of the colon should be repeated every 5-10 years through 79 years of age. However, you may need to be screened more often if early forms of precancerous polyps or small growths are found.  Skin Cancer  Check your skin from head to toe regularly.  Tell your health care provider about any new moles or changes in moles, especially if there is a change in a mole's shape or color.  Also tell your health care provider if you have a mole that is larger than the size of a pencil eraser.  Always use sunscreen. Apply sunscreen liberally and repeatedly throughout the day.  Protect yourself by wearing long sleeves, pants, a wide-brimmed hat, and sunglasses whenever you are outside.  Heart disease, diabetes, and high blood pressure  High blood pressure causes heart disease and increases the risk of stroke. High blood pressure is more likely to develop in: ? People who have blood pressure in the high end of the normal range (130-139/85-89 mm Hg). ? People who are overweight or obese. ? People who are African American.  If you are 64-28 years of age, have your blood pressure checked every 3-5 years. If you are 26 years of age or older, have your blood pressure checked every year. You should have your blood pressure measured twice-once when you are at a hospital or clinic, and once when you are not at a hospital or clinic. Record  the average of the two measurements. To check your blood pressure when you are not at a hospital or clinic, you  can use: ? An automated blood pressure machine at a pharmacy. ? A home blood pressure monitor.  If you are between 66 years and 37 years old, ask your health care provider if you should take aspirin to prevent strokes.  Have regular diabetes screenings. This involves taking a blood sample to check your fasting blood sugar level. ? If you are at a normal weight and have a low risk for diabetes, have this test once every three years after 79 years of age. ? If you are overweight and have a high risk for diabetes, consider being tested at a younger age or more often. Preventing infection Hepatitis B  If you have a higher risk for hepatitis B, you should be screened for this virus. You are considered at high risk for hepatitis B if: ? You were born in a country where hepatitis B is common. Ask your health care provider which countries are considered high risk. ? Your parents were born in a high-risk country, and you have not been immunized against hepatitis B (hepatitis B vaccine). ? You have HIV or AIDS. ? You use needles to inject street drugs. ? You live with someone who has hepatitis B. ? You have had sex with someone who has hepatitis B. ? You get hemodialysis treatment. ? You take certain medicines for conditions, including cancer, organ transplantation, and autoimmune conditions.  Hepatitis C  Blood testing is recommended for: ? Everyone born from [birth year] through 1963. ? Anyone with known risk factors for hepatitis C.  Sexually transmitted infections (STIs)  You should be screened for sexually transmitted infections (STIs) including gonorrhea and chlamydia if: ? You are sexually active and are younger than 79 years of age. ? You are older than 79 years of age and your health care provider tells you that you are at risk for this type of infection. ? Your sexual activity has changed since you were last screened and you are at an increased risk for chlamydia or gonorrhea. Ask your  health care provider if you are at risk.  If you do not have HIV, but are at risk, it may be recommended that you take a prescription medicine daily to prevent HIV infection. This is called pre-exposure prophylaxis (PrEP). You are considered at risk if: ? You are sexually active and do not regularly use condoms or know the HIV status of your partner(s). ? You take drugs by injection. ? You are sexually active with a partner who has HIV.  Talk with your health care provider about whether you are at high risk of being infected with HIV. If you choose to begin PrEP, you should first be tested for HIV. You should then be tested every 3 months for as long as you are taking PrEP. Pregnancy  If you are premenopausal and you may become pregnant, ask your health care provider about preconception counseling.  If you may become pregnant, take 400 to 800 micrograms (mcg) of folic acid every day.  If you want to prevent pregnancy, talk to your health care provider about birth control (contraception). Osteoporosis and menopause  Osteoporosis is a disease in which the bones lose minerals and strength with aging. This can result in serious bone fractures. Your risk for osteoporosis can be identified using a bone density scan.  If you are 54 years of age or older, or if you  are at risk for osteoporosis and fractures, ask your health care provider if you should be screened.  Ask your health care provider whether you should take a calcium or vitamin D supplement to lower your risk for osteoporosis.  Menopause may have certain physical symptoms and risks.  Hormone replacement therapy may reduce some of these symptoms and risks. Talk to your health care provider about whether hormone replacement therapy is right for you. Follow these instructions at home:  Schedule regular health, dental, and eye exams.  Stay current with your immunizations.  Do not use any tobacco products including cigarettes, chewing  tobacco, or electronic cigarettes.  If you are pregnant, do not drink alcohol.  If you are breastfeeding, limit how much and how often you drink alcohol.  Limit alcohol intake to no more than 1 drink per day for nonpregnant women. One drink equals 12 ounces of beer, 5 ounces of wine, or 1 ounces of hard liquor.  Do not use street drugs.  Do not share needles.  Ask your health care provider for help if you need support or information about quitting drugs.  Tell your health care provider if you often feel depressed.  Tell your health care provider if you have ever been abused or do not feel safe at home. This information is not intended to replace advice given to you by your health care provider. Make sure you discuss any questions you have with your health care provider. Document Released: 04/20/2011 Document Revised: 03/12/2016 Document Reviewed: 07/09/2015 Elsevier Interactive Patient Education  [employer].

## 2017-07-06 NOTE — Progress Notes (Deleted)
Subjective:   [name] is a 81 y.o. female who presents for Medicare Annual (Subsequent) preventive examination.  The Patient was informed that the wellness visit is to identify future health risk and educate and initiate measures that can reduce risk for increased disease through the lifespan.    Annual Wellness Assessment  Reports health as   Preventive Screening -Counseling & Management  Medicare Annual Preventive Care Visit - Subsequent Last OV   Describes Health as poor, fair, good or great?   VS reviewed;   Diet   BMI  Exercise  Dental  Stressors:   Sleep patterns:   Pain?    Cardiac Risk Factors Addressed Hyperlipidemia Diabetes Pre-diabetes A1c Obesity  Advanced Directives   Care Team: Kristian Covey, MD as PCP - General               Objective:     Vitals: There were no vitals taken for this visit.  There is no height or weight on file to calculate BMI.   Tobacco History  Smoking Status  . Never Smoker  Smokeless Tobacco  . Never Used     Counseling given: Not Answered   Past Medical History:  Diagnosis Date  . DEGENERATIVE JOINT DISEASE 08/25/2010  . HYPERLIPIDEMIA 03/08/2009  . HYPERTENSION 03/08/2009  . Obesity   . PREDIABETES 08/23/2009  . Renal calculi    Past Surgical History:  Procedure Laterality Date  . TONSILLECTOMY     Family History  Problem Relation Age of Onset  . Diabetes Father    History  Sexual Activity  . Sexual activity: Not on file    Outpatient Encounter Prescriptions as of 07/07/2017  Medication Sig  . ACCU-CHEK AVIVA PLUS test strip USE AS INSTRUCTED  . ACCU-CHEK SOFTCLIX LANCETS lancets Pt checks blood sugars twice per day. DX: E11.9  . aspirin 81 MG tablet Take 81 mg by mouth daily.    . hydrochlorothiazide (HYDRODIURIL) 25 MG tablet TAKE 1/2 TABLET BY MOUTH EVERY DAY  . Ketotifen Fumarate (ZADITOR OP) Apply 0.035 % to eye 2 (two) times daily.   . metFORMIN  (GLUCOPHAGE) 500 MG tablet TAKE 1 TABLET(500 MG) BY MOUTH TWICE DAILY WITH A MEAL  . multivitamin-iron-minerals-folic acid (CENTRUM) chewable tablet Chew 1 tablet by mouth daily.  . simvastatin (ZOCOR) 40 MG tablet Take 1 tablet (40 mg total) by mouth daily.   No facility-administered encounter medications on file as of 07/07/2017.     Activities of Daily Living In your present state of health, do you have any difficulty performing the following activities: 06/25/2017  Hearing? N  Vision? N  Difficulty concentrating or making decisions? (No Data)  Comment lives independently without issues  Walking or climbing stairs? Y  Dressing or bathing? N  Doing errands, shopping? N  Preparing Food and eating ? N  Using the Toilet? N  Do you have problems with loss of bowel control? N  Managing your Medications? N  Managing your Finances? N  Housekeeping or managing your Housekeeping? N  Some recent data might be hidden    Patient Care Team: Kristian Covey, MD as PCP - General    Assessment:    *** Exercise Activities and Dietary recommendations    Goals    . patient          Do better to control type 2 DM  Today it is 6.8         Fall Risk Fall Risk  06/25/2017 08/24/2016 06/15/2016  03/03/2015 03/01/2015  Falls in the past year? No No No No No  Comment - - Emmi Telephone Survey: data to providers prior to load - -   Depression Screen PHQ 2/9 Scores 06/25/2017 08/24/2016 03/03/2015 03/01/2015  PHQ - 2 Score 0 0 0 0     Cognitive Function MMSE - Mini Mental State Exam 06/25/2017  Not completed: Refused        Immunization History  Administered Date(s) Administered  . Influenza Split 08/27/2011, 09/01/2012  . Influenza Whole 08/23/2009, 08/25/2010  . Influenza, High Dose Seasonal PF 09/01/2013, 08/30/2015, 08/24/2016, 06/25/2017  . Influenza-Unspecified 08/04/2014  . Pneumococcal Conjugate-13 03/01/2014  . Pneumococcal Polysaccharide-23 10/20/2003   Screening Tests Health  Maintenance  Topic Date Due  . TETANUS/TDAP  11/05/1956  . DEXA SCAN  11/05/2002  . OPHTHALMOLOGY EXAM  07/09/2017  . URINE MICROALBUMIN  08/24/2017  . HEMOGLOBIN A1C  12/23/2017  . FOOT EXAM  06/25/2018  . INFLUENZA VACCINE  Completed  . PNA vac Low Risk Adult  Completed      Plan:   ***   I have personally reviewed and noted the following in the patient's chart:   . Medical and social history . Use of alcohol, tobacco or illicit drugs  . Current medications and supplements . Functional ability and status . Nutritional status . Physical activity . Advanced directives . List of other physicians . Hospitalizations, surgeries, and ER visits in previous 12 months . Vitals . Screenings to include cognitive, depression, and falls . Referrals and appointments  In addition, I have reviewed and discussed with patient certain preventive protocols, quality metrics, and best practice recommendations. A written personalized care plan for preventive services as well as general preventive health recommendations were provided to patient.     Montine Circle, RN  07/06/2017

## 2017-07-07 ENCOUNTER — Ambulatory Visit: Payer: Medicare Other

## 2017-07-12 LAB — HM DIABETES EYE EXAM

## 2017-08-18 ENCOUNTER — Encounter: Payer: Self-pay | Admitting: Family Medicine

## 2017-09-07 ENCOUNTER — Other Ambulatory Visit: Payer: Self-pay | Admitting: Family Medicine

## 2017-09-21 ENCOUNTER — Other Ambulatory Visit: Payer: Self-pay | Admitting: Family Medicine

## 2017-10-18 ENCOUNTER — Other Ambulatory Visit: Payer: Self-pay | Admitting: Family Medicine

## 2017-10-18 DIAGNOSIS — Z1231 Encounter for screening mammogram for malignant neoplasm of breast: Secondary | ICD-10-CM

## 2017-11-08 ENCOUNTER — Other Ambulatory Visit: Payer: Self-pay | Admitting: Family Medicine

## 2017-11-26 ENCOUNTER — Ambulatory Visit
Admission: RE | Admit: 2017-11-26 | Discharge: 2017-11-26 | Disposition: A | Discharge status: Home / Self Care | Payer: Medicare Other | Source: Ambulatory Visit | Attending: Family Medicine | Admitting: Family Medicine

## 2017-11-26 DIAGNOSIS — Z1231 Encounter for screening mammogram for malignant neoplasm of breast: Secondary | ICD-10-CM

## 2017-12-24 ENCOUNTER — Ambulatory Visit: Payer: Medicare Other | Admitting: Family Medicine

## 2017-12-24 ENCOUNTER — Encounter: Payer: Self-pay | Admitting: Family Medicine

## 2017-12-24 VITALS — BP 110/70 | HR 100 | Temp 97.8°F | Wt 190.5 lb

## 2017-12-24 DIAGNOSIS — E119 Type 2 diabetes mellitus without complications: Secondary | ICD-10-CM

## 2017-12-24 LAB — POCT GLYCOSYLATED HEMOGLOBIN (HGB A1C): Hemoglobin A1C: 7.2

## 2017-12-24 NOTE — Progress Notes (Signed)
Subjective:     Patient ID: [name], female   DOB: 04-21-1936, 82 y.o.   MRN: [id number]  HPI   Patient here for follow-up type 2 diabetes and hypertension. Not monitoring sugars regularly at home. She's had poor diet compliance recently. Eating lots of potatoes and macaroni and cheese. Also eats about 2-3 servings of fruit per day. Denies any polyuria or polydipsia. Lipids were checked last fall and stable. Last A1c 6.8%  No recent chest pains.  No headache.    Past Medical History:  Diagnosis Date  . DEGENERATIVE JOINT DISEASE 08/25/2010  . HYPERLIPIDEMIA 03/08/2009  . HYPERTENSION 03/08/2009  . Obesity   . PREDIABETES 08/23/2009  . Renal calculi    Past Surgical History:  Procedure Laterality Date  . TONSILLECTOMY      reports that  has never smoked. she has never used smokeless tobacco. She reports that she does not drink alcohol or use drugs. family history includes Diabetes in her father. No Known Allergies   Review of Systems  Constitutional: Negative for fatigue.  Eyes: Negative for visual disturbance.  Respiratory: Negative for cough, chest tightness, shortness of breath and wheezing.   Cardiovascular: Negative for chest pain, palpitations and leg swelling.  Endocrine: Negative for polydipsia and polyuria.  Neurological: Negative for dizziness, seizures, syncope, weakness, light-headedness and headaches.       Objective:   Physical Exam  Constitutional: She appears well-developed and well-nourished.  Eyes: Pupils are equal, round, and reactive to light.  Neck: Neck supple. No JVD present. No thyromegaly present.  Cardiovascular: Normal rate and regular rhythm. Exam reveals no gallop.  Pulmonary/Chest: Effort normal and breath sounds normal. No respiratory distress. She has no wheezes. She has no rales.  Musculoskeletal: She exhibits no edema.  Neurological: She is alert.       Assessment:     Type 2 diabetes. Fairly well controlled with A1c today 7.2%     Plan:     -Challenged her to tighten up diet and reduce starches -Hopefully she can lose a few pounds. Recheck A1c in 3 months  Kristian CoveyBruce W Cortasia Screws MD [facility]

## 2017-12-24 NOTE — Patient Instructions (Signed)
Scale back sugars and starches Let's plan on 3 month follow up.

## 2018-03-06 ENCOUNTER — Other Ambulatory Visit: Payer: Self-pay | Admitting: Family Medicine

## 2018-03-09 ENCOUNTER — Other Ambulatory Visit: Payer: Self-pay | Admitting: Family Medicine

## 2018-03-28 ENCOUNTER — Ambulatory Visit: Payer: Medicare Other | Admitting: Family Medicine

## 2018-03-28 ENCOUNTER — Encounter: Payer: Self-pay | Admitting: Family Medicine

## 2018-03-28 VITALS — BP 120/70 | HR 88 | Temp 97.7°F | Wt 189.1 lb

## 2018-03-28 DIAGNOSIS — E785 Hyperlipidemia, unspecified: Secondary | ICD-10-CM

## 2018-03-28 DIAGNOSIS — I10 Essential (primary) hypertension: Secondary | ICD-10-CM

## 2018-03-28 DIAGNOSIS — E119 Type 2 diabetes mellitus without complications: Secondary | ICD-10-CM | POA: Diagnosis not present

## 2018-03-28 LAB — POCT GLYCOSYLATED HEMOGLOBIN (HGB A1C): HEMOGLOBIN A1C: 6.9 % — AB (ref 4.0–5.6)

## 2018-03-28 NOTE — Patient Instructions (Signed)
Your A1C is improved at 6.9%.  Keep up the good work  Let's plan on 3 month follow up.  We will check lipids and kidney function at follow up.

## 2018-03-28 NOTE — Progress Notes (Signed)
  Subjective:     Patient ID: [name], female   DOB: 1936/04/13, 82 y.o.   MRN: [id number]  HPI Patient seen for medical follow-up. She has history of type 2 diabetes, hyperlipidemia, hypertension. Medications reviewed. Compliant with all. Last A1c 7.2%. She's been more diligent with watching diet over the past few months. She remains on simvastatin for hyperlipidemia. No recent chest pains. No cardiac history. No dizziness. No headaches. No recent falls.  Past Medical History:  Diagnosis Date  . DEGENERATIVE JOINT DISEASE 08/25/2010  . HYPERLIPIDEMIA 03/08/2009  . HYPERTENSION 03/08/2009  . Obesity   . PREDIABETES 08/23/2009  . Renal calculi    Past Surgical History:  Procedure Laterality Date  . TONSILLECTOMY      reports that she has never smoked. She has never used smokeless tobacco. She reports that she does not drink alcohol or use drugs. family history includes Diabetes in her father. No Known Allergies   Review of Systems  Constitutional: Negative for fatigue.  Eyes: Negative for visual disturbance.  Respiratory: Negative for cough, chest tightness, shortness of breath and wheezing.   Cardiovascular: Negative for chest pain, palpitations and leg swelling.  Gastrointestinal: Negative for abdominal pain.  Endocrine: Negative for polydipsia and polyuria.  Genitourinary: Negative for dysuria.  Neurological: Negative for dizziness, seizures, syncope, weakness, light-headedness and headaches.  Psychiatric/Behavioral: Negative for dysphoric mood.       Objective:   Physical Exam  Constitutional: She appears well-developed and well-nourished.  Eyes: Pupils are equal, round, and reactive to light.  Neck: Neck supple. No JVD present. No thyromegaly present.  Cardiovascular: Normal rate and regular rhythm. Exam reveals no gallop.  Pulmonary/Chest: Effort normal and breath sounds normal. No respiratory distress. She has no wheezes. She has no rales.  Musculoskeletal: She  exhibits no edema.  Neurological: She is alert.       Assessment:     #1 type 2 diabetes well controlled with A1c 6.9%  #2 hypertension stable and at goal  #3 dyslipidemia-treated with simvastatin    Plan:     -Continue current medications with no change made today -Plan three-month follow-up and obtain further labs at that point including lipids, basic chemistries, A1c -Reminder for yearly eye exam -Consider urine microalbumin at follow-up since she is not on ACE inhibitor or ARB  Kristian CoveyBruce W Holton Sidman MD [facility]

## 2018-05-11 ENCOUNTER — Other Ambulatory Visit: Payer: Self-pay | Admitting: Family Medicine

## 2018-06-04 ENCOUNTER — Other Ambulatory Visit: Payer: Self-pay | Admitting: Family Medicine

## 2018-06-28 ENCOUNTER — Encounter: Payer: Self-pay | Admitting: Family Medicine

## 2018-06-28 ENCOUNTER — Ambulatory Visit: Payer: Medicare Other | Admitting: Family Medicine

## 2018-06-28 VITALS — BP 170/80 | HR 91 | Temp 97.9°F | Wt 196.5 lb

## 2018-06-28 DIAGNOSIS — I10 Essential (primary) hypertension: Secondary | ICD-10-CM | POA: Diagnosis not present

## 2018-06-28 DIAGNOSIS — R42 Dizziness and giddiness: Secondary | ICD-10-CM | POA: Diagnosis not present

## 2018-06-28 DIAGNOSIS — E785 Hyperlipidemia, unspecified: Secondary | ICD-10-CM | POA: Diagnosis not present

## 2018-06-28 DIAGNOSIS — Z23 Encounter for immunization: Secondary | ICD-10-CM

## 2018-06-28 DIAGNOSIS — E119 Type 2 diabetes mellitus without complications: Secondary | ICD-10-CM | POA: Diagnosis not present

## 2018-06-28 LAB — HEPATIC FUNCTION PANEL
ALBUMIN: 4.2 g/dL (ref 3.5–5.2)
ALT: 14 U/L (ref 0–35)
AST: 14 U/L (ref 0–37)
Alkaline Phosphatase: 80 U/L (ref 39–117)
Bilirubin, Direct: 0.1 mg/dL (ref 0.0–0.3)
Total Bilirubin: 0.6 mg/dL (ref 0.2–1.2)
Total Protein: 7.6 g/dL (ref 6.0–8.3)

## 2018-06-28 LAB — LIPID PANEL
CHOL/HDL RATIO: 3
CHOLESTEROL: 201 mg/dL — AB (ref 0–200)
HDL: 60.6 mg/dL (ref >39.00)
LDL Cholesterol: 113 mg/dL — ABNORMAL HIGH (ref 0–99)
NonHDL: 140.43
TRIGLYCERIDES: 138 mg/dL (ref 0.0–149.0)
VLDL: 27.6 mg/dL (ref 0.0–40.0)

## 2018-06-28 LAB — BASIC METABOLIC PANEL
BUN: 14 mg/dL (ref 6–23)
CO2: 28 meq/L (ref 19–32)
Calcium: 10.5 mg/dL (ref 8.4–10.5)
Chloride: 101 mEq/L (ref 96–112)
Creatinine, Ser: 0.65 mg/dL (ref 0.40–1.20)
GFR: 112.61 mL/min (ref >60.00)
Glucose, Bld: 128 mg/dL — ABNORMAL HIGH (ref 70–99)
Potassium: 4.4 mEq/L (ref 3.5–5.1)
SODIUM: 137 meq/L (ref 135–145)

## 2018-06-28 LAB — POCT GLYCOSYLATED HEMOGLOBIN (HGB A1C): HEMOGLOBIN A1C: 6.7 % — AB (ref 4.0–5.6)

## 2018-06-28 LAB — MICROALBUMIN / CREATININE URINE RATIO
Creatinine,U: 29.3 mg/dL
MICROALB/CREAT RATIO: 2.4 mg/g (ref 0.0–30.0)

## 2018-06-28 NOTE — Progress Notes (Signed)
  Subjective:     Patient ID: [name], female   DOB: Sep 16, 1936, 82 y.o.   MRN: [id number]  HPI Patient seen for routine medical follow-up. Her chronic problems include history of type 2 diabetes, hypertension, hyperlipidemia, osteoarthritis.  Medications reviewed. She states she is compliant with all. Not monitoring blood sugars daily. A1c today 6.7%.  She's had some vertigo symptoms intermittently for the past few days. She states she gets similar symptoms about every 2-3 years. Symptoms worse early morning and triggered by head movement. No hearing loss. No ringing. No headache. No focal weakness.  Hyperlipidemia treated with simvastatin. She needs follow-up labs today.  Blood pressure is up today. Currently only takes HCTZ 25 mg one half tablet daily. Her blood pressures have been generally well controlled in the past. No alcohol use. Denies recent excessive sodium intake. No recent nonsteroidal use.  Past Medical History:  Diagnosis Date  . DEGENERATIVE JOINT DISEASE 08/25/2010  . HYPERLIPIDEMIA 03/08/2009  . HYPERTENSION 03/08/2009  . Obesity   . PREDIABETES 08/23/2009  . Renal calculi    Past Surgical History:  Procedure Laterality Date  . TONSILLECTOMY      reports that she has never smoked. She has never used smokeless tobacco. She reports that she does not drink alcohol or use drugs. family history includes Diabetes in her father. No Known Allergies   Review of Systems  Constitutional: Negative for fatigue.  Eyes: Negative for visual disturbance.  Respiratory: Negative for cough, chest tightness, shortness of breath and wheezing.   Cardiovascular: Negative for chest pain, palpitations and leg swelling.  Genitourinary: Negative for dysuria.  Neurological: Positive for dizziness. Negative for seizures, syncope, weakness, light-headedness and headaches.       Objective:   Physical Exam  Constitutional: She is oriented to person, place, and time. She appears  well-developed and well-nourished.  HENT:  Cerumen right canal  Eyes: Pupils are equal, round, and reactive to light.  Neck: Neck supple. No JVD present. No thyromegaly present.  Cardiovascular: Normal rate and regular rhythm. Exam reveals no gallop.  Pulmonary/Chest: Effort normal and breath sounds normal. No respiratory distress. She has no wheezes. She has no rales.  Musculoskeletal: She exhibits no edema.  Neurological: She is alert and oriented to person, place, and time. No cranial nerve deficit. Coordination normal.  Focal strength deficits       Assessment:     #1 type 2 diabetes well controlled with A1c 6.7%  #2 hyperlipidemia treated with simvastatin  #3 hypertension. Has been well controlled with past but is up today. This was confirmed with repeat reading  #4 intermittent vertigo. Nonfocal exam neurologically. Question benign peripheral positional vertigo    Plan:     -flu vaccine given -Check labs with lipid, hepatic, basic metabolic panel, urine microalbumin -Return in 2 weeks. If blood pressure still up at that point consider addition of ACE inhibitor or angiotensin receptor blocker -Consider vestibular rehabilitation if vertigo not resolving over the next week  Kristian Covey MD [facility] at [facility]

## 2018-06-28 NOTE — Patient Instructions (Signed)
Vertigo Vertigo is the feeling that you or your surroundings are moving when they are not. Vertigo can be dangerous if it occurs while you are doing something that could endanger you or others, such as driving. What are the causes? This condition is caused by a disturbance in the signals that are sent by your body's sensory systems to your brain. Different causes of a disturbance can lead to vertigo, including:  Infections, especially in the inner ear.  A bad reaction to a drug, or misuse of alcohol and medicines.  Withdrawal from drugs or alcohol.  Quickly changing positions, as when lying down or rolling over in bed.  Migraine headaches.  Decreased blood flow to the brain.  Decreased blood pressure.  Increased pressure in the brain from a head or neck injury, stroke, infection, tumor, or bleeding.  Central nervous system disorders.  What are the signs or symptoms? Symptoms of this condition usually occur when you move your head or your eyes in different directions. Symptoms may start suddenly, and they usually last for less than a minute. Symptoms may include:  Loss of balance and falling.  Feeling like you are spinning or moving.  Feeling like your surroundings are spinning or moving.  Nausea and vomiting.  Blurred vision or double vision.  Difficulty hearing.  Slurred speech.  Dizziness.  Involuntary eye movement (nystagmus).  Symptoms can be mild and cause only slight annoyance, or they can be severe and interfere with daily life. Episodes of vertigo may return (recur) over time, and they are often triggered by certain movements. Symptoms may improve over time. How is this diagnosed? This condition may be diagnosed based on medical history and the quality of your nystagmus. Your health care provider may test your eye movements by asking you to quickly change positions to trigger the nystagmus. This may be called the Dix-Hallpike test, head thrust test, or  roll test. You may be referred to a health care provider who specializes in ear, nose, and throat (ENT) problems (otolaryngologist) or a provider who specializes in disorders of the central nervous system (neurologist). You may have additional testing, including:  A physical exam.  Blood tests.  MRI.  A CT scan.  An electrocardiogram (ECG). This records electrical activity in your heart.  An electroencephalogram (EEG). This records electrical activity in your brain.  Hearing tests.  How is this treated? Treatment for this condition depends on the cause and the severity of the symptoms. Treatment options include:  Medicines to treat nausea or vertigo. These are usually used for severe cases. Some medicines that are used to treat other conditions may also reduce or eliminate vertigo symptoms. These include: ? Medicines that control allergies (antihistamines). ? Medicines that control seizures (anticonvulsants). ? Medicines that relieve depression (antidepressants). ? Medicines that relieve anxiety (sedatives).  Head movements to adjust your inner ear back to normal. If your vertigo is caused by an ear problem, your health care provider may recommend certain movements to correct the problem.  Surgery. This is rare.  Follow these instructions at home: Safety  Move slowly.Avoid sudden body or head movements.  Avoid driving.  Avoid operating heavy machinery.  Avoid doing any tasks that would cause danger to you or others if you would have a vertigo episode during the task.  If you have trouble walking or keeping your balance, try using a cane for stability. If you feel dizzy or unstable, sit down right away.  Return to your normal activities as told  by your health care provider. Ask your health care provider what activities are safe for you. General instructions  Take over-the-counter and prescription medicines only as told by your health care provider.  Avoid certain  positions or movements as told by your health care provider.  Drink enough fluid to keep your urine clear or pale yellow.  Keep all follow-up visits as told by your health care provider. This is important. Contact a health care provider if:  Your medicines do not relieve your vertigo or they make it worse.  You have a fever.  Your condition gets worse or you develop new symptoms.  Your family or friends notice any behavioral changes.  Your nausea or vomiting gets worse.  You have numbness or a "pins and needles" sensation in part of your body. Get help right away if:  You have difficulty moving or speaking.  You are always dizzy.  You faint.  You develop severe headaches.  You have weakness in your hands, arms, or legs.  You have changes in your hearing or vision.  You develop a stiff neck.  You develop sensitivity to light. This information is not intended to replace advice given to you by your health care provider. Make sure you discuss any questions you have with your health care provider. Document Released: 07/15/2005 Document Revised: 03/18/2016 Document Reviewed: 01/28/2015 Elsevier Interactive Patient Education  2018 [employer]. . Set up 2 week follow up

## 2018-07-12 ENCOUNTER — Encounter: Payer: Self-pay | Admitting: Family Medicine

## 2018-07-12 ENCOUNTER — Other Ambulatory Visit: Payer: Self-pay

## 2018-07-12 ENCOUNTER — Ambulatory Visit: Payer: Medicare Other | Admitting: Family Medicine

## 2018-07-12 VITALS — BP 132/72 | HR 84 | Temp 97.8°F | Wt 195.2 lb

## 2018-07-12 DIAGNOSIS — I10 Essential (primary) hypertension: Secondary | ICD-10-CM

## 2018-07-12 NOTE — Progress Notes (Signed)
  Subjective:     Patient ID: [name], female   DOB: 07/13/1936, 82 y.o.   MRN: [id number]  HPI Is here for follow-up regarding vertigo and elevated blood pressure.  She takes low-dose HCTZ.  Recent blood pressure 170/80.  She thinks she was having some stress issues regarding her vertigo and that may have been raising her blood pressure.  She had also eaten out a couple times.  Denies any headache at this time.  Vertigo symptoms have resolved.  No chest pains.  No dizziness.  Recent labs unremarkable.  Electrolytes and renal function stable.  Past Medical History:  Diagnosis Date  . DEGENERATIVE JOINT DISEASE 08/25/2010  . HYPERLIPIDEMIA 03/08/2009  . HYPERTENSION 03/08/2009  . Obesity   . PREDIABETES 08/23/2009  . Renal calculi    Past Surgical History:  Procedure Laterality Date  . TONSILLECTOMY      reports that she has never smoked. She has never used smokeless tobacco. She reports that she does not drink alcohol or use drugs. family history includes Diabetes in her father. No Known Allergies   Review of Systems  Constitutional: Negative for fatigue.  Eyes: Negative for visual disturbance.  Respiratory: Negative for cough, chest tightness, shortness of breath and wheezing.   Cardiovascular: Negative for chest pain, palpitations and leg swelling.  Neurological: Negative for dizziness, seizures, syncope, weakness, light-headedness and headaches.       Objective:   Physical Exam  Constitutional: She appears well-developed and well-nourished.  Eyes: Pupils are equal, round, and reactive to light.  Neck: Neck supple. No JVD present. No thyromegaly present.  Cardiovascular: Normal rate and regular rhythm. Exam reveals no gallop.  Pulmonary/Chest: Effort normal and breath sounds normal. No respiratory distress. She has no wheezes. She has no rales.  Musculoskeletal: She exhibits no edema.  Neurological: She is alert.       Assessment:     #1 recent transient vertigo  resolved  #2 hypertension.  Much improved today compared with visit 2 weeks ago    Plan:     -Continue current medications -Follow-up for any recurrent vertigo or other concerns, otherwise routine follow-up in 3 months  Kristian CoveyBruce W Renna Kilmer MD [facility]

## 2018-07-13 LAB — HM DIABETES EYE EXAM

## 2018-07-20 ENCOUNTER — Encounter: Payer: Self-pay | Admitting: Family Medicine

## 2018-08-07 ENCOUNTER — Other Ambulatory Visit: Payer: Self-pay | Admitting: Family Medicine

## 2018-09-02 ENCOUNTER — Other Ambulatory Visit: Payer: Self-pay | Admitting: Family Medicine

## 2018-10-11 ENCOUNTER — Ambulatory Visit: Payer: Medicare Other | Admitting: Family Medicine

## 2018-11-03 ENCOUNTER — Other Ambulatory Visit: Payer: Self-pay | Admitting: Family Medicine

## 2018-11-16 ENCOUNTER — Other Ambulatory Visit: Payer: Self-pay | Admitting: Family Medicine

## 2018-11-17 ENCOUNTER — Other Ambulatory Visit: Payer: Self-pay | Admitting: Family Medicine

## 2019-01-31 ENCOUNTER — Other Ambulatory Visit: Payer: Self-pay | Admitting: Family Medicine

## 2019-02-01 NOTE — Telephone Encounter (Signed)
Patient has an appointment schedule on 02/03/19

## 2019-02-03 ENCOUNTER — Other Ambulatory Visit: Payer: Self-pay

## 2019-02-03 ENCOUNTER — Ambulatory Visit (INDEPENDENT_AMBULATORY_CARE_PROVIDER_SITE_OTHER): Payer: Medicare Other | Admitting: Family Medicine

## 2019-02-03 DIAGNOSIS — I10 Essential (primary) hypertension: Secondary | ICD-10-CM | POA: Diagnosis not present

## 2019-02-03 DIAGNOSIS — E785 Hyperlipidemia, unspecified: Secondary | ICD-10-CM | POA: Diagnosis not present

## 2019-02-03 DIAGNOSIS — E119 Type 2 diabetes mellitus without complications: Secondary | ICD-10-CM | POA: Diagnosis not present

## 2019-02-03 MED ORDER — METFORMIN HCL 500 MG PO TABS: 500.0000 mg | ORAL_TABLET | Freq: Two times a day (BID) | ORAL | 3 refills | Status: DC

## 2019-02-03 MED ORDER — SIMVASTATIN 40 MG PO TABS: ORAL_TABLET | ORAL | 3 refills | Status: DC

## 2019-02-03 MED ORDER — HYDROCHLOROTHIAZIDE 25 MG PO TABS: 12.5000 mg | ORAL_TABLET | Freq: Every day | ORAL | 3 refills | Status: DC

## 2019-02-03 NOTE — Progress Notes (Signed)
Patient ID: [name], female   DOB: Jul 15, 1936, 83 y.o.   MRN: [id number]  Virtual Visit via Video Note  I connected with [name] on 02/03/19 at  3:30 PM EDT by a video enabled telemedicine application and verified that I am speaking with the correct person using two identifiers.  Location patient: home Location provider:work or home office Persons participating in the virtual visit: patient, provider  I discussed the limitations of evaluation and management by telemedicine and the availability of in person appointments. The patient expressed understanding and agreed to proceed.   HPI: Patient has chronic problems including hypertension, type 2 diabetes, osteoarthritis, hyperlipidemia.  Medications reviewed.  She needs refills of simvastatin, metformin, and HCTZ.  Last A1c was 6.7%.  Her CBGs been very stable by home readings.  She states her blood pressures also been very stable.  She denies any headaches or dizziness.  No chest pains.  No recent falls.  She has a nephew who is helping with things like groceries.  She has been very isolated during current pandemic.  Appetite and weight stable.   ROS: See pertinent positives and negatives per HPI.  Past Medical History:  Diagnosis Date  . DEGENERATIVE JOINT DISEASE 08/25/2010  . HYPERLIPIDEMIA 03/08/2009  . HYPERTENSION 03/08/2009  . Obesity   . PREDIABETES 08/23/2009  . Renal calculi     Past Surgical History:  Procedure Laterality Date  . TONSILLECTOMY      Family History  Problem Relation Age of Onset  . Diabetes Father     SOCIAL HX: She is widowed.  Non-smoker.  No alcohol.  Retired Engineer, site.   Current Outpatient Medications:  .  ACCU-CHEK AVIVA PLUS test strip, USE AS INSTRUCTED, Disp: 100 each, Rfl: 1 .  ACCU-CHEK SOFTCLIX LANCETS lancets, Pt checks blood sugars twice per day. DX: E11.9, Disp: 100 each, Rfl: 3 .  aspirin 81 MG tablet, Take 81 mg by mouth daily.  , Disp: , Rfl:  .  hydrochlorothiazide  (HYDRODIURIL) 25 MG tablet, Take 0.5 tablets (12.5 mg total) by mouth daily., Disp: 45 tablet, Rfl: 3 .  Ketotifen Fumarate (ZADITOR OP), Apply 0.035 % to eye 2 (two) times daily. , Disp: , Rfl:  .  metFORMIN (GLUCOPHAGE) 500 MG tablet, Take 1 tablet (500 mg total) by mouth 2 (two) times daily with a meal., Disp: 180 tablet, Rfl: 3 .  multivitamin-iron-minerals-folic acid (CENTRUM) chewable tablet, Chew 1 tablet by mouth daily., Disp: , Rfl:  .  simvastatin (ZOCOR) 40 MG tablet, TAKE 1 TABLET BY MOUTH EVERY DAY. NEED TO SCHEDULE A PHYSICAL FOR MORE REFILLS, Disp: 90 tablet, Rfl: 3  EXAM:  VITALS per patient if applicable:  GENERAL: alert, oriented, appears well and in no acute distress  HEENT: atraumatic, conjunttiva clear, no obvious abnormalities on inspection of external nose and ears  NECK: normal movements of the head and neck  LUNGS: on inspection no signs of respiratory distress, breathing rate appears normal, no obvious gross SOB, gasping or wheezing  CV: no obvious cyanosis  MS: moves all visible extremities without noticeable abnormality  PSYCH/NEURO: pleasant and cooperative, no obvious depression or anxiety, speech and thought processing grossly intact  ASSESSMENT AND PLAN:  Discussed the following assessment and plan:  #1 type 2 diabetes.  History of good control -Refilled metformin for 1 year -We will plan on repeat A1c at follow-up in a few months  #2 hypertension stable -Refill HCTZ for 1 year -Recheck basic metabolic panel at follow-up  #3  dyslipidemia -Refilled simvastatin for 1 year -She had lipids checked in September and these were reviewed and stable   I discussed the assessment and treatment plan with the patient. The patient was provided an opportunity to ask questions and all were answered. The patient agreed with the plan and demonstrated an understanding of the instructions.   The patient was advised to call back or seek an in-person evaluation if  the symptoms worsen or if the condition fails to improve as anticipated.   Evelena PeatBruce Jhane Lorio, MD

## 2019-03-06 ENCOUNTER — Other Ambulatory Visit: Payer: Self-pay | Admitting: Family Medicine

## 2019-04-12 ENCOUNTER — Encounter: Payer: Self-pay | Admitting: Family Medicine

## 2019-04-12 ENCOUNTER — Telehealth: Payer: Self-pay

## 2019-04-12 NOTE — Telephone Encounter (Signed)
done

## 2019-04-12 NOTE — Telephone Encounter (Signed)
Do you need a Doxy visit for this?  Copied from [place] [id number]. Topic: General - Other >> Apr 11, 2019  3:17 PM Carolyn Stare wrote: Pt cal lto say she writing her will and she need a letter from Dr Elease Hashimoto stating that she is competent .

## 2019-04-13 NOTE — Telephone Encounter (Signed)
Patient has been made aware. She asked that we mail the letter. I have placed it in our outgoing mail.

## 2019-10-16 LAB — HM DIABETES EYE EXAM

## 2019-12-11 DIAGNOSIS — E119 Type 2 diabetes mellitus without complications: Secondary | ICD-10-CM | POA: Diagnosis not present

## 2019-12-11 DIAGNOSIS — I10 Essential (primary) hypertension: Secondary | ICD-10-CM | POA: Diagnosis not present

## 2019-12-11 DIAGNOSIS — Z7984 Long term (current) use of oral hypoglycemic drugs: Secondary | ICD-10-CM | POA: Diagnosis not present

## 2019-12-11 DIAGNOSIS — E785 Hyperlipidemia, unspecified: Secondary | ICD-10-CM | POA: Diagnosis not present

## 2019-12-11 DIAGNOSIS — Z6825 Body mass index (BMI) 25.0-25.9, adult: Secondary | ICD-10-CM | POA: Diagnosis not present

## 2020-01-24 ENCOUNTER — Other Ambulatory Visit: Payer: Self-pay | Admitting: Family Medicine

## 2020-01-24 ENCOUNTER — Telehealth: Payer: Self-pay | Admitting: Family Medicine

## 2020-01-24 NOTE — Telephone Encounter (Signed)
[facility] is calling for clarification on a refill that was just faxed. It has two sets of directions .please call [phone] to speak with Fleet Contras

## 2020-01-24 NOTE — Telephone Encounter (Signed)
Clarified with pharmacy nothing further needed

## 2020-02-01 ENCOUNTER — Other Ambulatory Visit: Payer: Self-pay

## 2020-02-02 ENCOUNTER — Ambulatory Visit (INDEPENDENT_AMBULATORY_CARE_PROVIDER_SITE_OTHER): Payer: Medicare HMO | Admitting: Family Medicine

## 2020-02-02 ENCOUNTER — Other Ambulatory Visit: Payer: Self-pay | Admitting: Family Medicine

## 2020-02-02 ENCOUNTER — Encounter: Payer: Self-pay | Admitting: Family Medicine

## 2020-02-02 VITALS — BP 124/60 | HR 92 | Temp 97.6°F | Ht 61.0 in | Wt 196.5 lb

## 2020-02-02 DIAGNOSIS — R4189 Other symptoms and signs involving cognitive functions and awareness: Secondary | ICD-10-CM | POA: Diagnosis not present

## 2020-02-02 DIAGNOSIS — I10 Essential (primary) hypertension: Secondary | ICD-10-CM

## 2020-02-02 DIAGNOSIS — E785 Hyperlipidemia, unspecified: Secondary | ICD-10-CM | POA: Diagnosis not present

## 2020-02-02 DIAGNOSIS — E119 Type 2 diabetes mellitus without complications: Secondary | ICD-10-CM | POA: Diagnosis not present

## 2020-02-02 LAB — BASIC METABOLIC PANEL
BUN: 8 mg/dL (ref 6–23)
CO2: 30 mEq/L (ref 19–32)
Calcium: 10.6 mg/dL — ABNORMAL HIGH (ref 8.4–10.5)
Chloride: 101 mEq/L (ref 96–112)
Creatinine, Ser: 0.71 mg/dL (ref 0.40–1.20)
GFR: 95.3 mL/min (ref >60.00)
Glucose, Bld: 128 mg/dL — ABNORMAL HIGH (ref 70–99)
Potassium: 4.5 mEq/L (ref 3.5–5.1)
Sodium: 139 mEq/L (ref 135–145)

## 2020-02-02 LAB — CBC WITH DIFFERENTIAL/PLATELET
Basophils Absolute: 0.1 10*3/uL (ref 0.0–0.1)
Basophils Relative: 2 % (ref 0.0–3.0)
Eosinophils Absolute: 0.2 10*3/uL (ref 0.0–0.7)
Eosinophils Relative: 4.5 % (ref 0.0–5.0)
HCT: 43 % (ref 36.0–46.0)
Hemoglobin: 14.1 g/dL (ref 12.0–15.0)
Lymphocytes Relative: 45.2 % (ref 12.0–46.0)
Lymphs Abs: 2.4 10*3/uL (ref 0.7–4.0)
MCHC: 32.7 g/dL (ref 30.0–36.0)
MCV: 84.7 fl (ref 78.0–100.0)
Monocytes Absolute: 0.7 10*3/uL (ref 0.1–1.0)
Monocytes Relative: 13.1 % — ABNORMAL HIGH (ref 3.0–12.0)
Neutro Abs: 1.9 10*3/uL (ref 1.4–7.7)
Neutrophils Relative %: 35.2 % — ABNORMAL LOW (ref 43.0–77.0)
Platelets: 313 10*3/uL (ref 150.0–400.0)
RBC: 5.08 Mil/uL (ref 3.87–5.11)
RDW: 14.3 % (ref 11.5–15.5)
WBC: 5.4 10*3/uL (ref 4.0–10.5)

## 2020-02-02 LAB — LIPID PANEL
Cholesterol: 228 mg/dL — ABNORMAL HIGH (ref 0–200)
HDL: 53.5 mg/dL (ref >39.00)
LDL Cholesterol: 144 mg/dL — ABNORMAL HIGH (ref 0–99)
NonHDL: 174.49
Total CHOL/HDL Ratio: 4
Triglycerides: 150 mg/dL — ABNORMAL HIGH (ref 0.0–149.0)
VLDL: 30 mg/dL (ref 0.0–40.0)

## 2020-02-02 LAB — HEPATIC FUNCTION PANEL
ALT: 24 U/L (ref 0–35)
AST: 19 U/L (ref 0–37)
Albumin: 4.4 g/dL (ref 3.5–5.2)
Alkaline Phosphatase: 78 U/L (ref 39–117)
Bilirubin, Direct: 0.1 mg/dL (ref 0.0–0.3)
Total Bilirubin: 0.8 mg/dL (ref 0.2–1.2)
Total Protein: 7.7 g/dL (ref 6.0–8.3)

## 2020-02-02 LAB — MICROALBUMIN / CREATININE URINE RATIO
Creatinine,U: 140.7 mg/dL
Microalb Creat Ratio: 0.6 mg/g (ref 0.0–30.0)
Microalb, Ur: 0.8 mg/dL (ref 0.0–1.9)

## 2020-02-02 LAB — TSH: TSH: 5.48 u[IU]/mL — ABNORMAL HIGH (ref 0.35–4.50)

## 2020-02-02 LAB — HEMOGLOBIN A1C: Hgb A1c MFr Bld: 7.9 % — ABNORMAL HIGH (ref 4.6–6.5)

## 2020-02-02 LAB — VITAMIN B12: Vitamin B-12: 362 pg/mL (ref 211–911)

## 2020-02-02 MED ORDER — DONEPEZIL HCL 5 MG PO TABS: 5.0000 mg | ORAL_TABLET | Freq: Every day | ORAL | 0 refills | Status: DC

## 2020-02-02 NOTE — Progress Notes (Signed)
Subjective:     Patient ID: [name], female   DOB: [birth year], 84 y.o.   MRN: [id number]  HPI   Ms. Lillibridge is here for physical exam and medical follow-up.  Her chronic problems include history of obesity, hypertension, type 2 diabetes, osteoarthritis, hyperlipidemia.  It was noted during our conversation today that she seemed to be repeating herself somewhat which we had not noted previously.  For example, she 3 different times went into her pocket book to look for her Covid vaccination and could not remember what we had asked for.  And she repeated herself at least 2 or 3 times with specific topics we were discussing.  She is not monitoring her blood sugars apparently.  Medications reviewed.  She states she has had Covid vaccine but does not have any definite proof of this time.  She has hypertension.   Denies any headache or dizziness.    Family history reviewed.  Her mother had multiple myeloma.  Father sister died of unknown cause  Past Medical History:  Diagnosis Date  . DEGENERATIVE JOINT DISEASE 08/25/2010  . HYPERLIPIDEMIA 03/08/2009  . HYPERTENSION 03/08/2009  . Obesity   . PREDIABETES 08/23/2009  . Renal calculi    Past Surgical History:  Procedure Laterality Date  . TONSILLECTOMY      reports that she has never smoked. She has never used smokeless tobacco. She reports that she does not drink alcohol or use drugs. family history includes Diabetes in her father. No Known Allergies .    Review of Systems  Constitutional: Negative for appetite change, fatigue and unexpected weight change.  Eyes: Negative for visual disturbance.  Respiratory: Negative for cough, chest tightness, shortness of breath and wheezing.   Cardiovascular: Negative for chest pain, palpitations and leg swelling.  Gastrointestinal: Negative for abdominal pain.  Endocrine: Negative for polydipsia and polyuria.  Genitourinary: Negative for dysuria.  Neurological: Negative for dizziness, seizures,  syncope, weakness, light-headedness and headaches.       Objective:   Physical Exam Constitutional:      Appearance: She is well-developed.  Eyes:     Pupils: Pupils are equal, round, and reactive to light.  Neck:     Thyroid: No thyromegaly.     Vascular: No JVD.  Cardiovascular:     Rate and Rhythm: Normal rate and regular rhythm.     Heart sounds: No gallop.   Pulmonary:     Effort: Pulmonary effort is normal. No respiratory distress.     Breath sounds: Normal breath sounds. No wheezing or rales.  Musculoskeletal:     Cervical back: Neck supple.  Neurological:     Mental Status: She is alert.  Psychiatric:     Comments: 21/30 on MMSE        Assessment:     Physical exam.  She has multiple chronic problems as above.  Concerning today on exam is the fact that she is exhibiting some cognitive deficits and scored 21 out of 30 on MMSE.  She has not been screened to our knowledge previously  Type 2 diabetes with history of fairly good control  Hypertension stable and at goal  Hyperlipidemia treated with statin    Plan:     -Check labs including CBC, hepatic panel, lipid panel, basic metabolic panel, I3B, urine microalbumin, TSH, B12 level  -We discussed possible initiation of Aricept 5 mg nightly and reassess in 1 month.  Consider titration of 10 mg that time if tolerating well  Thus far has  no reported issues driving but this could be a concern soon.  Eulas Post MD [facility]

## 2020-02-02 NOTE — Patient Instructions (Signed)
Preventive Care 38 Years and Older, Female Preventive care refers to lifestyle choices and visits with your health care provider that can promote health and wellness. This includes:  A yearly physical exam. This is also called an annual well check.  Regular dental and eye exams.  Immunizations.  Screening for certain conditions.  Healthy lifestyle choices, such as diet and exercise. What can I expect for my preventive care visit? Physical exam Your health care provider will check:  Height and weight. These may be used to calculate body mass index (BMI), which is a measurement that tells if you are at a healthy weight.  Heart rate and blood pressure.  Your skin for abnormal spots. Counseling Your health care provider may ask you questions about:  Alcohol, tobacco, and drug use.  Emotional well-being.  Home and relationship well-being.  Sexual activity.  Eating habits.  History of falls.  Memory and ability to understand (cognition).  Work and work Statistician.  Pregnancy and menstrual history. What immunizations do I need?  Influenza (flu) vaccine  This is recommended every year. Tetanus, diphtheria, and pertussis (Tdap) vaccine  You may need a Td booster every 10 years. Varicella (chickenpox) vaccine  You may need this vaccine if you have not already been vaccinated. Zoster (shingles) vaccine  You may need this after age 33. Pneumococcal conjugate (PCV13) vaccine  One dose is recommended after age 33. Pneumococcal polysaccharide (PPSV23) vaccine  One dose is recommended after age 72. Measles, mumps, and rubella (MMR) vaccine  You may need at least one dose of MMR if you were born in 1957 or later. You may also need a second dose. Meningococcal conjugate (MenACWY) vaccine  You may need this if you have certain conditions. Hepatitis A vaccine  You may need this if you have certain conditions or if you travel or work in places where you may be exposed  to hepatitis A. Hepatitis B vaccine  You may need this if you have certain conditions or if you travel or work in places where you may be exposed to hepatitis B. Haemophilus influenzae type b (Hib) vaccine  You may need this if you have certain conditions. You may receive vaccines as individual doses or as more than one vaccine together in one shot (combination vaccines). Talk with your health care provider about the risks and benefits of combination vaccines. What tests do I need? Blood tests  Lipid and cholesterol levels. These may be checked every 5 years, or more frequently depending on your overall health.  Hepatitis C test.  Hepatitis B test. Screening  Lung cancer screening. You may have this screening every year starting at age 39 if you have a 30-pack-year history of smoking and currently smoke or have quit within the past 15 years.  Colorectal cancer screening. All adults should have this screening starting at age 36 and continuing until age 15. Your health care provider may recommend screening at age 23 if you are at increased risk. You will have tests every 1-10 years, depending on your results and the type of screening test.  Diabetes screening. This is done by checking your blood sugar (glucose) after you have not eaten for a while (fasting). You may have this done every 1-3 years.  Mammogram. This may be done every 1-2 years. Talk with your health care provider about how often you should have regular mammograms.  BRCA-related cancer screening. This may be done if you have a family history of breast, ovarian, tubal, or peritoneal cancers.  Other tests  Sexually transmitted disease (STD) testing.  Bone density scan. This is done to screen for osteoporosis. You may have this done starting at age 82. Follow these instructions at home: Eating and drinking  Eat a diet that includes fresh fruits and vegetables, whole grains, lean protein, and low-fat dairy products. Limit  your intake of foods with high amounts of sugar, saturated fats, and salt.  Take vitamin and mineral supplements as recommended by your health care provider.  Do not drink alcohol if your health care provider tells you not to drink.  If you drink alcohol: ? Limit how much you have to 0-1 drink a day. ? Be aware of how much alcohol is in your drink. In the [place] one drink equals one 12 oz bottle of beer (355 mL), one 5 oz glass of wine (148 mL), or one 1 oz glass of hard liquor (44 mL). Lifestyle  Take daily care of your teeth and gums.  Stay active. Exercise for at least 30 minutes on 5 or more days each week.  Do not use any products that contain nicotine or tobacco, such as cigarettes, e-cigarettes, and chewing tobacco. If you need help quitting, ask your health care provider.  If you are sexually active, practice safe sex. Use a condom or other form of protection in order to prevent STIs (sexually transmitted infections).  Talk with your health care provider about taking a low-dose aspirin or statin. What's next?  Go to your health care provider once a year for a well check visit.  Ask your health care provider how often you should have your eyes and teeth checked.  Stay up to date on all vaccines. This information is not intended to replace advice given to you by your health care provider. Make sure you discuss any questions you have with your health care provider. Document Revised: 09/29/2018 Document Reviewed: 09/29/2018 Elsevier Patient Education  [place] the Aricept 5 mg one at night  Set up one month follow up.

## 2020-02-05 ENCOUNTER — Telehealth: Payer: Self-pay | Admitting: Family Medicine

## 2020-02-05 NOTE — Telephone Encounter (Signed)
Pt returned call about lab results, she would like a call back.

## 2020-02-05 NOTE — Telephone Encounter (Signed)
See result note.  

## 2020-02-07 ENCOUNTER — Telehealth: Payer: Self-pay | Admitting: Family Medicine

## 2020-02-07 MED ORDER — SIMVASTATIN 40 MG PO TABS: ORAL_TABLET | ORAL | 3 refills | Status: DC

## 2020-02-07 NOTE — Telephone Encounter (Signed)
Medication sent in. 

## 2020-02-07 NOTE — Telephone Encounter (Signed)
Pt call and need a refill on simvastatin (ZOCOR) 40 MG tablet  Sent to  [id number] - [place], [place] - [address] AT [address] Phone:  [phone]  Fax:  [phone]     pt stated she is out of it.

## 2020-03-04 ENCOUNTER — Encounter: Payer: Self-pay | Admitting: Family Medicine

## 2020-03-04 ENCOUNTER — Other Ambulatory Visit: Payer: Self-pay

## 2020-03-04 ENCOUNTER — Ambulatory Visit (INDEPENDENT_AMBULATORY_CARE_PROVIDER_SITE_OTHER): Payer: Medicare HMO | Admitting: Family Medicine

## 2020-03-04 VITALS — BP 116/62 | HR 95 | Temp 97.6°F | Wt 199.0 lb

## 2020-03-04 DIAGNOSIS — E785 Hyperlipidemia, unspecified: Secondary | ICD-10-CM | POA: Diagnosis not present

## 2020-03-04 DIAGNOSIS — R4189 Other symptoms and signs involving cognitive functions and awareness: Secondary | ICD-10-CM | POA: Diagnosis not present

## 2020-03-04 DIAGNOSIS — E119 Type 2 diabetes mellitus without complications: Secondary | ICD-10-CM | POA: Diagnosis not present

## 2020-03-04 DIAGNOSIS — F0390 Unspecified dementia without behavioral disturbance: Secondary | ICD-10-CM | POA: Insufficient documentation

## 2020-03-04 DIAGNOSIS — E1165 Type 2 diabetes mellitus with hyperglycemia: Secondary | ICD-10-CM | POA: Diagnosis not present

## 2020-03-04 DIAGNOSIS — R7989 Other specified abnormal findings of blood chemistry: Secondary | ICD-10-CM | POA: Diagnosis not present

## 2020-03-04 MED ORDER — HYDROCHLOROTHIAZIDE 25 MG PO TABS: 25.0000 mg | ORAL_TABLET | Freq: Every day | ORAL | 3 refills | Status: DC

## 2020-03-04 MED ORDER — METFORMIN HCL 500 MG PO TABS: ORAL_TABLET | ORAL | 3 refills | Status: DC

## 2020-03-04 NOTE — Progress Notes (Signed)
  Subjective:     Patient ID: [name], female   DOB: 03-14-1936, 84 y.o.   MRN: [id number]  HPI Ms. Teall is seen for medical follow-up.  She has chronic problems including obesity, hypertension, type 2 diabetes, osteoarthritis, hyperlipidemia.  Had concerns about her cognitive performance last visit.  She scored 21/30 on MMSE.  We prescribed Aricept but she seemed confused about this medication not clear she is taking this.  She states "I am taking everything you prescribed."  She had several abnormalities with recent labs including elevated lipids, elevated TSH, and elevated A1c of 7.9%.  We had some concern whether she was taking her cholesterol medication regularly.  She states she is taking her Metformin regularly.  Her cholesterol is up to 228 with LDL 144.  Past Medical History:  Diagnosis Date  . DEGENERATIVE JOINT DISEASE 08/25/2010  . HYPERLIPIDEMIA 03/08/2009  . HYPERTENSION 03/08/2009  . Obesity   . PREDIABETES 08/23/2009  . Renal calculi    Past Surgical History:  Procedure Laterality Date  . TONSILLECTOMY      reports that she has never smoked. She has never used smokeless tobacco. She reports that she does not drink alcohol or use drugs. family history includes Diabetes in her father. No Known Allergies   Review of Systems  Constitutional: Negative for fatigue and unexpected weight change.  Eyes: Negative for visual disturbance.  Respiratory: Negative for cough, chest tightness, shortness of breath and wheezing.   Cardiovascular: Negative for chest pain, palpitations and leg swelling.  Gastrointestinal: Negative for abdominal pain, nausea and vomiting.  Endocrine: Negative for polydipsia and polyuria.  Genitourinary: Negative for dysuria.  Neurological: Negative for dizziness, seizures, syncope, weakness, light-headedness and headaches.       Objective:   Physical Exam Vitals reviewed.  Constitutional:      Appearance: Normal appearance.  Cardiovascular:      Rate and Rhythm: Normal rate and regular rhythm.  Pulmonary:     Effort: Pulmonary effort is normal.     Breath sounds: Normal breath sounds.  Musculoskeletal:     Right lower leg: No edema.     Left lower leg: No edema.  Neurological:     Mental Status: She is alert.        Assessment:     #1 recent elevated TSH 5.48.  Denies any other overt symptoms of hypothyroidism.  She thinks her mom may have had hypothyroidism  #2 hyperlipidemia.  Patient is supposed be on simvastatin but recent lipids were up.  Not clear if she was taking her simvastatin regularly  #3 poorly controlled diabetes with recent A1c 7.9%  #4 cognitive impairment by recent MMSE.  Not clear whether she is taking Aricept    Plan:     -We strongly advised pillbox to help with medications and she states she is already doing that.  We may have to check into getting another advocate to help with her medication ministration of possible  -Increase Metformin 500 mg to 2 tablets twice daily and reassess A1c in 3 months  -Repeat TSH today along with free T4  -Consider repeat lipids at follow-up  Kristian Covey MD [facility] '

## 2020-04-28 ENCOUNTER — Other Ambulatory Visit: Payer: Self-pay | Admitting: Family Medicine

## 2020-04-29 ENCOUNTER — Other Ambulatory Visit: Payer: Self-pay | Admitting: Family Medicine

## 2020-05-05 ENCOUNTER — Other Ambulatory Visit: Payer: Self-pay | Admitting: Family Medicine

## 2020-06-04 ENCOUNTER — Ambulatory Visit (INDEPENDENT_AMBULATORY_CARE_PROVIDER_SITE_OTHER): Payer: Medicare HMO | Admitting: Family Medicine

## 2020-06-04 ENCOUNTER — Encounter: Payer: Self-pay | Admitting: Family Medicine

## 2020-06-04 ENCOUNTER — Other Ambulatory Visit: Payer: Self-pay

## 2020-06-04 VITALS — BP 118/70 | HR 84 | Temp 98.2°F | Wt 187.0 lb

## 2020-06-04 DIAGNOSIS — I10 Essential (primary) hypertension: Secondary | ICD-10-CM | POA: Diagnosis not present

## 2020-06-04 DIAGNOSIS — R7989 Other specified abnormal findings of blood chemistry: Secondary | ICD-10-CM

## 2020-06-04 DIAGNOSIS — E119 Type 2 diabetes mellitus without complications: Secondary | ICD-10-CM | POA: Diagnosis not present

## 2020-06-04 DIAGNOSIS — R4189 Other symptoms and signs involving cognitive functions and awareness: Secondary | ICD-10-CM | POA: Diagnosis not present

## 2020-06-04 DIAGNOSIS — E785 Hyperlipidemia, unspecified: Secondary | ICD-10-CM | POA: Diagnosis not present

## 2020-06-04 LAB — POCT GLYCOSYLATED HEMOGLOBIN (HGB A1C): Hemoglobin A1C: 7 % — AB (ref 4.0–5.6)

## 2020-06-04 MED ORDER — DONEPEZIL HCL 10 MG PO TABS
10.0000 mg | ORAL_TABLET | Freq: Every day | ORAL | 3 refills | Status: DC
Start: 2020-06-04 — End: 2021-06-16

## 2020-06-04 NOTE — Patient Instructions (Signed)
Increase the Aricept to 10 mg at night (was on 5 mg)  Keep sugar and white starch intake down.

## 2020-06-04 NOTE — Progress Notes (Signed)
Established Patient Office Visit  Subjective:  Patient ID: [name], female    DOB: 14-Aug-1936  Age: 84 y.o. MRN: [id number]  CC: No chief complaint on file.   HPI [name] presents for medical follow-up.  She has history of some cognitive impairment and last visit scored 21/30 on MMSE.  B12 level was normal.  Her TSH was mildly elevated.  We recommended follow-up TSH but this was never drawn.  We started donepezil 5 mg nightly which she is tolerating well.  She basically is getting care every day now with a sitter  Type 2 diabetes.  Last A1c 7.9%.  We increased her Metformin 500 mg to 2 twice daily.  Recent renal function normal with GFR 95.3.  She is not monitoring blood sugars regularly.  No polyuria or polydipsia.  Hyperlipidemia.  Recent lipids cholesterol 228 with triglycerides 150 and LDL 144.  She is on simvastatin 40 mg daily but is not clear she was taking her simvastatin regularly.  She has someone overseeing her medications now  Apparently does drink some sodas.  Eats ice cream but not daily.  Caregiver today had several questions regarding diet  Past Medical History:  Diagnosis Date  . DEGENERATIVE JOINT DISEASE 08/25/2010  . HYPERLIPIDEMIA 03/08/2009  . HYPERTENSION 03/08/2009  . Obesity   . PREDIABETES 08/23/2009  . Renal calculi     Past Surgical History:  Procedure Laterality Date  . TONSILLECTOMY      Family History  Problem Relation Age of Onset  . Diabetes Father     Social History   Socioeconomic History  . Marital status: Single    Spouse name: Not on file  . Number of children: Not on file  . Years of education: Not on file  . Highest education level: Not on file  Occupational History  . Not on file  Tobacco Use  . Smoking status: Never Smoker  . Smokeless tobacco: Never Used  Vaping Use  . Vaping Use: Never used  Substance and Sexual Activity  . Alcohol use: No  . Drug use: No  . Sexual activity: Not on file  Other Topics  Concern  . Not on file  Social History Narrative  . Not on file   Social Determinants of Health   Financial Resource Strain:   . Difficulty of Paying Living Expenses:   Food Insecurity:   . Worried About Programme researcher, broadcasting/film/video in the Last Year:   . Barista in the Last Year:   Transportation Needs:   . Freight forwarder (Medical):   Marland Kitchen Lack of Transportation (Non-Medical):   Physical Activity:   . Days of Exercise per Week:   . Minutes of Exercise per Session:   Stress:   . Feeling of Stress :   Social Connections:   . Frequency of Communication with Friends and Family:   . Frequency of Social Gatherings with Friends and Family:   . Attends Religious Services:   . Active Member of Clubs or Organizations:   . Attends Banker Meetings:   Marland Kitchen Marital Status:   Intimate Partner Violence:   . Fear of Current or Ex-Partner:   . Emotionally Abused:   Marland Kitchen Physically Abused:   . Sexually Abused:     Outpatient Medications Prior to Visit  Medication Sig Dispense Refill  . ACCU-CHEK AVIVA PLUS test strip USE AS INSTRUCTED 100 each 1  . ACCU-CHEK SOFTCLIX LANCETS lancets Pt checks blood sugars twice  per day. DX: E11.9 100 each 3  . aspirin 81 MG tablet Take 81 mg by mouth daily.      . hydrochlorothiazide (HYDRODIURIL) 25 MG tablet TAKE 1/2 TABLET BY MOUTH DAILY 45 tablet 0  . Ketotifen Fumarate (ZADITOR OP) Apply 0.035 % to eye 2 (two) times daily.     . metFORMIN (GLUCOPHAGE) 500 MG tablet Take two tablets twice daily with breakfast and supper. 360 tablet 3  . multivitamin-iron-minerals-folic acid (CENTRUM) chewable tablet Chew 1 tablet by mouth daily.    . simvastatin (ZOCOR) 40 MG tablet TAKE 1 TABLET BY MOUTH EVERY DAY. 90 tablet 3  . donepezil (ARICEPT) 5 MG tablet TAKE 1 TABLET(5 MG) BY MOUTH AT BEDTIME 90 tablet 0   No facility-administered medications prior to visit.    No Known Allergies  ROS Review of Systems  Constitutional: Negative for chills,  fatigue and fever.  Eyes: Negative for visual disturbance.  Respiratory: Negative for cough, chest tightness, shortness of breath and wheezing.   Cardiovascular: Negative for chest pain, palpitations and leg swelling.  Endocrine: Negative for polydipsia and polyuria.  Neurological: Negative for dizziness, seizures, syncope, weakness, light-headedness and headaches.      Objective:    Physical Exam Vitals reviewed.  Constitutional:      Appearance: Normal appearance.  Cardiovascular:     Rate and Rhythm: Normal rate and regular rhythm.  Pulmonary:     Effort: Pulmonary effort is normal.     Breath sounds: Normal breath sounds.  Musculoskeletal:     Right lower leg: No edema.     Left lower leg: No edema.  Neurological:     General: No focal deficit present.     Mental Status: She is alert.     BP 118/70 (BP Location: Left Arm, Patient Position: Sitting, Cuff Size: Normal)   Pulse 84   Temp 98.2 F (36.8 C) (Oral)   Wt 187 lb (84.8 kg)   SpO2 98%   BMI 35.33 kg/m  Wt Readings from Last 3 Encounters:  06/04/20 187 lb (84.8 kg)  03/04/20 199 lb (90.3 kg)  02/02/20 196 lb 8 oz (89.1 kg)     Health Maintenance Due  Topic Date Due  . COVID-19 Vaccine (1) Never done  . TETANUS/TDAP  Never done  . DEXA SCAN  Never done  . FOOT EXAM  06/25/2018  . INFLUENZA VACCINE  05/19/2020    There are no preventive care reminders to display for this patient.  Lab Results  Component Value Date   TSH 5.48 (H) 02/02/2020   Lab Results  Component Value Date   WBC 5.4 02/02/2020   HGB 14.1 02/02/2020   HCT 43.0 02/02/2020   MCV 84.7 02/02/2020   PLT 313.0 02/02/2020   Lab Results  Component Value Date   NA 139 02/02/2020   K 4.5 02/02/2020   CO2 30 02/02/2020   GLUCOSE 128 (H) 02/02/2020   BUN 8 02/02/2020   CREATININE 0.71 02/02/2020   BILITOT 0.8 02/02/2020   ALKPHOS 78 02/02/2020   AST 19 02/02/2020   ALT 24 02/02/2020   PROT 7.7 02/02/2020   ALBUMIN 4.4  02/02/2020   CALCIUM 10.6 (H) 02/02/2020   GFR 95.30 02/02/2020   Lab Results  Component Value Date   CHOL 228 (H) 02/02/2020   Lab Results  Component Value Date   HDL 53.50 02/02/2020   Lab Results  Component Value Date   LDLCALC 144 (H) 02/02/2020   Lab Results  Component  Value Date   TRIG 150.0 (H) 02/02/2020   Lab Results  Component Value Date   CHOLHDL 4 02/02/2020   Lab Results  Component Value Date   HGBA1C 7.0 (A) 06/04/2020      Assessment & Plan:   Problem List Items Addressed This Visit      Unprioritized   Cognitive impairment   Type 2 diabetes mellitus, controlled (HCC) - Primary   Relevant Orders   POCT glycosylated hemoglobin (Hb A1C) (Completed)   Hyperlipidemia   Relevant Orders   Lipid panel   Essential hypertension    Other Visit Diagnoses    Elevated TSH       Relevant Orders   TSH   T4, Free    -Titrate Aricept to 10 mg nightly with new prescription sent -Discussed dietary factors in some detail.  Eliminate sodas and reduce high glycemic foods -Fortunately, her A1c is improved today to 7.0% -Repeat check TSH and free T4 and if TSH is climbing consider initiating thyroid replacement -Repeat lipid panel.  Hopefully this will be improved with her recent improved compliance with medication through oversight -Routine follow-up in 3 months and sooner as needed  Meds ordered this encounter  Medications  . donepezil (ARICEPT) 10 MG tablet    Sig: Take 1 tablet (10 mg total) by mouth at bedtime.    Dispense:  90 tablet    Refill:  3    Follow-up: Return in about 3 months (around 09/04/2020).    Evelena Peat, MD

## 2020-06-05 LAB — T4, FREE: Free T4: 0.9 ng/dL (ref 0.8–1.8)

## 2020-06-05 LAB — LIPID PANEL
Cholesterol: 218 mg/dL — ABNORMAL HIGH (ref <200)
HDL: 64 mg/dL (ref >=50)
LDL Cholesterol (Calc): 134 mg/dL (calc) — ABNORMAL HIGH
Non-HDL Cholesterol (Calc): 154 mg/dL (calc) — ABNORMAL HIGH (ref <130)
Total CHOL/HDL Ratio: 3.4 (calc) (ref <5.0)
Triglycerides: 94 mg/dL (ref <150)

## 2020-06-05 LAB — TSH: TSH: 4.4 mIU/L (ref 0.40–4.50)

## 2020-07-17 ENCOUNTER — Telehealth: Payer: Self-pay | Admitting: Family Medicine

## 2020-07-17 NOTE — Telephone Encounter (Signed)
Left message for patient to schedule Annual Wellness Visit.  Please schedule with Nurse Health Advisor Shannon Crews, RN at [facility]  

## 2020-09-04 ENCOUNTER — Other Ambulatory Visit: Payer: Self-pay

## 2020-09-04 ENCOUNTER — Ambulatory Visit (INDEPENDENT_AMBULATORY_CARE_PROVIDER_SITE_OTHER): Payer: Medicare HMO | Admitting: Family Medicine

## 2020-09-04 ENCOUNTER — Encounter: Payer: Self-pay | Admitting: Family Medicine

## 2020-09-04 VITALS — BP 135/81 | HR 88 | Ht 61.0 in | Wt 192.0 lb

## 2020-09-04 DIAGNOSIS — I10 Essential (primary) hypertension: Secondary | ICD-10-CM

## 2020-09-04 DIAGNOSIS — Z23 Encounter for immunization: Secondary | ICD-10-CM | POA: Diagnosis not present

## 2020-09-04 DIAGNOSIS — E785 Hyperlipidemia, unspecified: Secondary | ICD-10-CM

## 2020-09-04 DIAGNOSIS — R4189 Other symptoms and signs involving cognitive functions and awareness: Secondary | ICD-10-CM

## 2020-09-04 DIAGNOSIS — E119 Type 2 diabetes mellitus without complications: Secondary | ICD-10-CM | POA: Diagnosis not present

## 2020-09-04 LAB — POCT GLYCOSYLATED HEMOGLOBIN (HGB A1C): Hemoglobin A1C: 7.2 % — AB (ref 4.0–5.6)

## 2020-09-04 MED ORDER — HYDROCHLOROTHIAZIDE 25 MG PO TABS: 12.5000 mg | ORAL_TABLET | Freq: Every day | ORAL | 3 refills | Status: DC

## 2020-09-04 NOTE — Patient Instructions (Signed)
STOP the Aspirin use  Consider trial of over the counter Voltaren/Diclofenac gel for knee arthritis  Let's plan on 6 month follow up.

## 2020-09-04 NOTE — Progress Notes (Signed)
Established Patient Office Visit  Subjective:  Patient ID: [name], female    DOB: 12/17/1935  Age: 84 y.o. MRN: [id number]  CC:  Chief Complaint  Patient presents with  . Follow-up    HPI [name] presents for medical follow-up.  She has history of hypertension, type 2 diabetes, osteoarthritis, hyperlipidemia, cognitive impairment with probable early dementia.  Her medications include Aricept 10 mg daily, HCTZ 12.5 mg daily, Metformin 5 mg twice daily, and simvastatin 40 mg daily.  Not monitoring blood sugars regularly at home.  Her last A1c was 7.0%.  She has had her Covid vaccines.  She has had weight gain of about 5 pounds since last visit.  Somewhat poor compliance with diet at times.  No recent falls.  She is getting in-home care with assistance 5 days/week  Has recently complained of some bilateral knee pains and stiffness.  Does not take anything for this.  Sometimes uses over-the-counter topical sports creams  Past Medical History:  Diagnosis Date  . DEGENERATIVE JOINT DISEASE 08/25/2010  . HYPERLIPIDEMIA 03/08/2009  . HYPERTENSION 03/08/2009  . Obesity   . PREDIABETES 08/23/2009  . Renal calculi     Past Surgical History:  Procedure Laterality Date  . TONSILLECTOMY      Family History  Problem Relation Age of Onset  . Diabetes Father     Social History   Socioeconomic History  . Marital status: Single    Spouse name: Not on file  . Number of children: Not on file  . Years of education: Not on file  . Highest education level: Not on file  Occupational History  . Not on file  Tobacco Use  . Smoking status: Never Smoker  . Smokeless tobacco: Never Used  Vaping Use  . Vaping Use: Never used  Substance and Sexual Activity  . Alcohol use: No  . Drug use: No  . Sexual activity: Not on file  Other Topics Concern  . Not on file  Social History Narrative  . Not on file   Social Determinants of Health   Financial Resource Strain:   .  Difficulty of Paying Living Expenses: Not on file  Food Insecurity:   . Worried About Programme researcher, broadcasting/film/video in the Last Year: Not on file  . Ran Out of Food in the Last Year: Not on file  Transportation Needs:   . Lack of Transportation (Medical): Not on file  . Lack of Transportation (Non-Medical): Not on file  Physical Activity:   . Days of Exercise per Week: Not on file  . Minutes of Exercise per Session: Not on file  Stress:   . Feeling of Stress : Not on file  Social Connections:   . Frequency of Communication with Friends and Family: Not on file  . Frequency of Social Gatherings with Friends and Family: Not on file  . Attends Religious Services: Not on file  . Active Member of Clubs or Organizations: Not on file  . Attends Banker Meetings: Not on file  . Marital Status: Not on file  Intimate Partner Violence:   . Fear of Current or Ex-Partner: Not on file  . Emotionally Abused: Not on file  . Physically Abused: Not on file  . Sexually Abused: Not on file    Outpatient Medications Prior to Visit  Medication Sig Dispense Refill  . ACCU-CHEK AVIVA PLUS test strip USE AS INSTRUCTED 100 each 1  . ACCU-CHEK SOFTCLIX LANCETS lancets Pt checks blood  sugars twice per day. DX: E11.9 100 each 3  . donepezil (ARICEPT) 10 MG tablet Take 1 tablet (10 mg total) by mouth at bedtime. 90 tablet 3  . Ketotifen Fumarate (ZADITOR OP) Apply 0.035 % to eye 2 (two) times daily.     . metFORMIN (GLUCOPHAGE) 500 MG tablet Take two tablets twice daily with breakfast and supper. 360 tablet 3  . multivitamin-iron-minerals-folic acid (CENTRUM) chewable tablet Chew 1 tablet by mouth daily.    . simvastatin (ZOCOR) 40 MG tablet TAKE 1 TABLET BY MOUTH EVERY DAY. 90 tablet 3  . aspirin 81 MG tablet Take 81 mg by mouth daily.      . hydrochlorothiazide (HYDRODIURIL) 25 MG tablet TAKE 1/2 TABLET BY MOUTH DAILY 45 tablet 0   No facility-administered medications prior to visit.    No Known  Allergies  ROS Review of Systems  Constitutional: Negative for fatigue.  Eyes: Negative for visual disturbance.  Respiratory: Negative for cough, chest tightness, shortness of breath and wheezing.   Cardiovascular: Negative for chest pain, palpitations and leg swelling.  Endocrine: Negative for polydipsia and polyuria.  Musculoskeletal: Positive for arthralgias.  Neurological: Negative for dizziness, seizures, syncope, weakness, light-headedness and headaches.      Objective:    Physical Exam Constitutional:      Appearance: She is well-developed.  Eyes:     Pupils: Pupils are equal, round, and reactive to light.  Neck:     Thyroid: No thyromegaly.     Vascular: No JVD.  Cardiovascular:     Rate and Rhythm: Normal rate and regular rhythm.     Heart sounds: No gallop.   Pulmonary:     Effort: Pulmonary effort is normal. No respiratory distress.     Breath sounds: Normal breath sounds. No wheezing or rales.  Musculoskeletal:     Cervical back: Neck supple.     Comments: Mild crepitus with flexion extension of both knees.  No effusion.  No localized tenderness.  Neurological:     Mental Status: She is alert.     BP 135/81   Pulse 88   Ht 5\' 1"  (1.549 m)   Wt 192 lb (87.1 kg)   BMI 36.28 kg/m  Wt Readings from Last 3 Encounters:  09/04/20 192 lb (87.1 kg)  06/04/20 187 lb (84.8 kg)  03/04/20 199 lb (90.3 kg)     Health Maintenance Due  Topic Date Due  . TETANUS/TDAP  Never done  . DEXA SCAN  Never done  . FOOT EXAM  06/25/2018    There are no preventive care reminders to display for this patient.  Lab Results  Component Value Date   TSH 4.40 06/04/2020   Lab Results  Component Value Date   WBC 5.4 02/02/2020   HGB 14.1 02/02/2020   HCT 43.0 02/02/2020   MCV 84.7 02/02/2020   PLT 313.0 02/02/2020   Lab Results  Component Value Date   NA 139 02/02/2020   K 4.5 02/02/2020   CO2 30 02/02/2020   GLUCOSE 128 (H) 02/02/2020   BUN 8 02/02/2020    CREATININE 0.71 02/02/2020   BILITOT 0.8 02/02/2020   ALKPHOS 78 02/02/2020   AST 19 02/02/2020   ALT 24 02/02/2020   PROT 7.7 02/02/2020   ALBUMIN 4.4 02/02/2020   CALCIUM 10.6 (H) 02/02/2020   GFR 95.30 02/02/2020   Lab Results  Component Value Date   CHOL 218 (H) 06/04/2020   Lab Results  Component Value Date   HDL 64  06/04/2020   Lab Results  Component Value Date   LDLCALC 134 (H) 06/04/2020   Lab Results  Component Value Date   TRIG 94 06/04/2020   Lab Results  Component Value Date   CHOLHDL 3.4 06/04/2020   Lab Results  Component Value Date   HGBA1C 7.2 (A) 09/04/2020      Assessment & Plan:   #1 type 2 diabetes.  Overall stable.  A1c up just slightly at 7.2% -Tighten up dietary control.  Recommend 32-month follow-up.  If climbing at that time consider further titration of Metformin  #2 hypertension stable -Continue low-dose HCTZ 12.5 mg daily.  Recheck basic metabolic panel at follow-up  #3 dyslipidemia treated with simvastatin -Recheck lipid and hepatic panel at 53-month follow-up  #4 cognitive impairment.  Patient on Aricept 10 mg daily.  Overall stable. -She is getting assistance from several family members and also has sitter with her 5 days/week.  This seems to be working currently  Meds ordered this encounter  Medications  . hydrochlorothiazide (HYDRODIURIL) 25 MG tablet    Sig: Take 0.5 tablets (12.5 mg total) by mouth daily.    Dispense:  45 tablet    Refill:  3    Follow-up: Return in about 6 months (around 03/04/2021).    Evelena Peat, MD

## 2020-11-28 ENCOUNTER — Telehealth: Payer: Self-pay | Admitting: Family Medicine

## 2020-11-28 NOTE — Telephone Encounter (Signed)
Left message for patient to call back and schedule Medicare Annual Wellness Visit (AWV) either virtually or in office. No detailed message left   Last AWV  06/25/17  please schedule at anytime with [facility] Nurse Health Advisor 1 or 2   This should be a 45 minute visit.

## 2021-03-04 ENCOUNTER — Other Ambulatory Visit: Payer: Self-pay | Admitting: Family Medicine

## 2021-03-04 ENCOUNTER — Other Ambulatory Visit: Payer: Self-pay

## 2021-03-04 ENCOUNTER — Encounter: Payer: Self-pay | Admitting: Family Medicine

## 2021-03-04 ENCOUNTER — Ambulatory Visit (INDEPENDENT_AMBULATORY_CARE_PROVIDER_SITE_OTHER): Payer: Medicare HMO | Admitting: Family Medicine

## 2021-03-04 VITALS — BP 178/80 | HR 84 | Temp 98.1°F | Wt 184.0 lb

## 2021-03-04 DIAGNOSIS — E119 Type 2 diabetes mellitus without complications: Secondary | ICD-10-CM | POA: Diagnosis not present

## 2021-03-04 DIAGNOSIS — R4189 Other symptoms and signs involving cognitive functions and awareness: Secondary | ICD-10-CM

## 2021-03-04 DIAGNOSIS — E785 Hyperlipidemia, unspecified: Secondary | ICD-10-CM

## 2021-03-04 DIAGNOSIS — I10 Essential (primary) hypertension: Secondary | ICD-10-CM

## 2021-03-04 LAB — POCT GLYCOSYLATED HEMOGLOBIN (HGB A1C): Hemoglobin A1C: 7.1 % — AB (ref 4.0–5.6)

## 2021-03-04 MED ORDER — AMLODIPINE BESYLATE 5 MG PO TABS
5.0000 mg | ORAL_TABLET | Freq: Every day | ORAL | 1 refills | Status: DC
Start: 2021-03-04 — End: 2021-04-02

## 2021-03-04 NOTE — Progress Notes (Signed)
Established Patient Office Visit  Subjective:  Patient ID: [name], female    DOB: June 19, 1936  Age: 85 y.o. MRN: [id number]  CC:  Chief Complaint  Patient presents with  . Follow-up    diabetes    HPI [name] presents for medical follow-up.  She has history of short-term memory loss and likely Alzheimer's dementia.  She has caregiver with her today.  Type 2 diabetes.  Diabetes treated with metformin.  She does have oversight of her medications.  A1c has been fairly consistent and stable today at 7.1%.  She does have hypertension and currently only taking HCTZ 12.5 mg daily.  Blood pressures not monitored at home.  Her weight is actually down 8 pounds from last visit.  Caregivers have been trying to watch her dietary intake little closer.  Difficult to assess from history how much sodium she is taking in.  No alcohol use.  Past Medical History:  Diagnosis Date  . DEGENERATIVE JOINT DISEASE 08/25/2010  . HYPERLIPIDEMIA 03/08/2009  . HYPERTENSION 03/08/2009  . Obesity   . PREDIABETES 08/23/2009  . Renal calculi     Past Surgical History:  Procedure Laterality Date  . TONSILLECTOMY      Family History  Problem Relation Age of Onset  . Diabetes Father     Social History   Socioeconomic History  . Marital status: Single    Spouse name: Not on file  . Number of children: Not on file  . Years of education: Not on file  . Highest education level: Not on file  Occupational History  . Not on file  Tobacco Use  . Smoking status: Never Smoker  . Smokeless tobacco: Never Used  Vaping Use  . Vaping Use: Never used  Substance and Sexual Activity  . Alcohol use: No  . Drug use: No  . Sexual activity: Not on file  Other Topics Concern  . Not on file  Social History Narrative  . Not on file   Social Determinants of Health   Financial Resource Strain: Not on file  Food Insecurity: Not on file  Transportation Needs: Not on file  Physical Activity: Not on file   Stress: Not on file  Social Connections: Not on file  Intimate Partner Violence: Not on file    Outpatient Medications Prior to Visit  Medication Sig Dispense Refill  . ACCU-CHEK AVIVA PLUS test strip USE AS INSTRUCTED 100 each 1  . ACCU-CHEK SOFTCLIX LANCETS lancets Pt checks blood sugars twice per day. DX: E11.9 100 each 3  . donepezil (ARICEPT) 10 MG tablet Take 1 tablet (10 mg total) by mouth at bedtime. 90 tablet 3  . hydrochlorothiazide (HYDRODIURIL) 25 MG tablet Take 0.5 tablets (12.5 mg total) by mouth daily. 45 tablet 3  . Ketotifen Fumarate (ZADITOR OP) Apply 0.035 % to eye 2 (two) times daily.     . metFORMIN (GLUCOPHAGE) 500 MG tablet Take two tablets twice daily with breakfast and supper. 360 tablet 3  . multivitamin-iron-minerals-folic acid (CENTRUM) chewable tablet Chew 1 tablet by mouth daily.    . simvastatin (ZOCOR) 40 MG tablet TAKE 1 TABLET BY MOUTH EVERY DAY. 90 tablet 3   No facility-administered medications prior to visit.    No Known Allergies  ROS Review of Systems  Constitutional: Negative for fatigue.  Respiratory: Negative for cough, chest tightness, shortness of breath and wheezing.   Cardiovascular: Negative for chest pain, palpitations and leg swelling.  Endocrine: Negative for polydipsia and polyuria.  Neurological: Negative for dizziness, seizures, syncope, weakness, light-headedness and headaches.      Objective:    Physical Exam Vitals reviewed.  Constitutional:      Appearance: Normal appearance.  Cardiovascular:     Rate and Rhythm: Normal rate and regular rhythm.  Pulmonary:     Effort: Pulmonary effort is normal.     Breath sounds: Normal breath sounds.  Musculoskeletal:     Right lower leg: No edema.     Left lower leg: No edema.  Neurological:     Mental Status: She is alert.     BP (!) 178/80 (BP Location: Left Arm, Cuff Size: Normal)   Pulse 84   Temp 98.1 F (36.7 C) (Oral)   Wt 184 lb (83.5 kg)   SpO2 97%   BMI  34.77 kg/m  Wt Readings from Last 3 Encounters:  03/04/21 184 lb (83.5 kg)  09/04/20 192 lb (87.1 kg)  06/04/20 187 lb (84.8 kg)     Health Maintenance Due  Topic Date Due  . TETANUS/TDAP  Never done  . DEXA SCAN  Never done  . FOOT EXAM  06/25/2018  . OPHTHALMOLOGY EXAM  10/15/2020  . URINE MICROALBUMIN  02/01/2021    There are no preventive care reminders to display for this patient.  Lab Results  Component Value Date   TSH 4.40 06/04/2020   Lab Results  Component Value Date   WBC 5.4 02/02/2020   HGB 14.1 02/02/2020   HCT 43.0 02/02/2020   MCV 84.7 02/02/2020   PLT 313.0 02/02/2020   Lab Results  Component Value Date   NA 139 02/02/2020   K 4.5 02/02/2020   CO2 30 02/02/2020   GLUCOSE 128 (H) 02/02/2020   BUN 8 02/02/2020   CREATININE 0.71 02/02/2020   BILITOT 0.8 02/02/2020   ALKPHOS 78 02/02/2020   AST 19 02/02/2020   ALT 24 02/02/2020   PROT 7.7 02/02/2020   ALBUMIN 4.4 02/02/2020   CALCIUM 10.6 (H) 02/02/2020   GFR 95.30 02/02/2020   Lab Results  Component Value Date   CHOL 218 (H) 06/04/2020   Lab Results  Component Value Date   HDL 64 06/04/2020   Lab Results  Component Value Date   LDLCALC 134 (H) 06/04/2020   Lab Results  Component Value Date   TRIG 94 06/04/2020   Lab Results  Component Value Date   CHOLHDL 3.4 06/04/2020   Lab Results  Component Value Date   HGBA1C 7.1 (A) 03/04/2021      Assessment & Plan:   Problem List Items Addressed This Visit      Unprioritized   Essential hypertension   Relevant Medications   amLODipine (NORVASC) 5 MG tablet   Hyperlipidemia   Relevant Medications   amLODipine (NORVASC) 5 MG tablet   Type 2 diabetes mellitus, controlled (HCC) - Primary   Relevant Orders   POCT glycosylated hemoglobin (Hb A1C) (Completed)    Her diabetes is stable with A1c 7.1%.  Continue metformin.  Blood pressure poorly controlled.  Repeat by me 178 systolic.  This is after several minutes of rest.  We  recommend adding amlodipine 5 mg daily and reassess in 3 to 4 weeks.  Plan to get follow-up labs then including lipids then repeat chemistries  Try to keep sodium intake less than 2400 mg daily  Meds ordered this encounter  Medications  . amLODipine (NORVASC) 5 MG tablet    Sig: Take 1 tablet (5 mg total) by mouth daily.  Dispense:  30 tablet    Refill:  1    Follow-up: Return in about 1 month (around 04/04/2021).    Evelena Peat, MD

## 2021-03-04 NOTE — Patient Instructions (Signed)

## 2021-03-08 ENCOUNTER — Other Ambulatory Visit: Payer: Self-pay | Admitting: Family Medicine

## 2021-03-27 ENCOUNTER — Telehealth: Payer: Self-pay

## 2021-03-27 ENCOUNTER — Ambulatory Visit: Payer: Medicare HMO

## 2021-03-27 NOTE — Telephone Encounter (Signed)
Attempted to call patient 3 times to completed scheduled MWV scheduled 315 today. No answer and no answering machine to leave a message.   Pt may schedule next available telephone visit appointment if she returns call.

## 2021-03-27 NOTE — Progress Notes (Signed)
Err

## 2021-04-01 ENCOUNTER — Other Ambulatory Visit: Payer: Self-pay

## 2021-04-02 ENCOUNTER — Encounter: Payer: Self-pay | Admitting: Family Medicine

## 2021-04-02 ENCOUNTER — Ambulatory Visit (INDEPENDENT_AMBULATORY_CARE_PROVIDER_SITE_OTHER): Payer: Medicare HMO | Admitting: Family Medicine

## 2021-04-02 ENCOUNTER — Other Ambulatory Visit: Payer: Self-pay | Admitting: Family Medicine

## 2021-04-02 VITALS — BP 140/80 | HR 89 | Temp 98.3°F | Wt 180.8 lb

## 2021-04-02 DIAGNOSIS — E785 Hyperlipidemia, unspecified: Secondary | ICD-10-CM

## 2021-04-02 DIAGNOSIS — I10 Essential (primary) hypertension: Secondary | ICD-10-CM

## 2021-04-02 DIAGNOSIS — E119 Type 2 diabetes mellitus without complications: Secondary | ICD-10-CM

## 2021-04-02 DIAGNOSIS — F0390 Unspecified dementia without behavioral disturbance: Secondary | ICD-10-CM | POA: Diagnosis not present

## 2021-04-02 LAB — BASIC METABOLIC PANEL
BUN: 16 mg/dL (ref 6–23)
CO2: 31 mEq/L (ref 19–32)
Calcium: 9.9 mg/dL (ref 8.4–10.5)
Chloride: 102 mEq/L (ref 96–112)
Creatinine, Ser: 0.78 mg/dL (ref 0.40–1.20)
GFR: 70.25 mL/min (ref >60.00)
Glucose, Bld: 139 mg/dL — ABNORMAL HIGH (ref 70–99)
Potassium: 3.7 mEq/L (ref 3.5–5.1)
Sodium: 140 mEq/L (ref 135–145)

## 2021-04-02 LAB — HEPATIC FUNCTION PANEL
ALT: 13 U/L (ref 0–35)
AST: 14 U/L (ref 0–37)
Albumin: 4.2 g/dL (ref 3.5–5.2)
Alkaline Phosphatase: 71 U/L (ref 39–117)
Bilirubin, Direct: 0.1 mg/dL (ref 0.0–0.3)
Total Bilirubin: 0.5 mg/dL (ref 0.2–1.2)
Total Protein: 7.7 g/dL (ref 6.0–8.3)

## 2021-04-02 LAB — LIPID PANEL
Cholesterol: 189 mg/dL (ref 0–200)
HDL: 51.9 mg/dL (ref >39.00)
LDL Cholesterol: 118 mg/dL — ABNORMAL HIGH (ref 0–99)
NonHDL: 137.02
Total CHOL/HDL Ratio: 4
Triglycerides: 97 mg/dL (ref 0.0–149.0)
VLDL: 19.4 mg/dL (ref 0.0–40.0)

## 2021-04-02 MED ORDER — AMLODIPINE BESYLATE 5 MG PO TABS: 5.0000 mg | ORAL_TABLET | Freq: Every day | ORAL | 3 refills | Status: DC

## 2021-04-02 NOTE — Progress Notes (Signed)
Established Patient Office Visit  Subjective:  Patient ID: [name], female    DOB: [birth year]  Age: 85 y.o. MRN: [id number]  CC:  Chief Complaint  Patient presents with   Follow-up    hypertension    HPI [name] presents for follow-up regarding poorly controlled hypertension.  We added amlodipine 5 mg daily to her HCTZ.  They are not monitoring blood pressures at home and currently do not have a cuff.  Patient has dementia so history is unreliable.  She does deny any headache.  She has lost a few pounds and apparently caregivers have made some dietary changes with eating out less often.  No complaints of dizziness, peripheral edema, headaches, syncope.  She has hyperlipidemia treated with simvastatin.  Due for follow-up lipids soon.  She has type 2 diabetes with recent A1c 7.1%.  Remains on metformin.  Past Medical History:  Diagnosis Date   DEGENERATIVE JOINT DISEASE 08/25/2010   HYPERLIPIDEMIA 03/08/2009   HYPERTENSION 03/08/2009   Obesity    PREDIABETES 08/23/2009   Renal calculi     Past Surgical History:  Procedure Laterality Date   TONSILLECTOMY      Family History  Problem Relation Age of Onset   Diabetes Father     Social History   Socioeconomic History   Marital status: Single    Spouse name: Not on file   Number of children: Not on file   Years of education: Not on file   Highest education level: Not on file  Occupational History   Not on file  Tobacco Use   Smoking status: Never   Smokeless tobacco: Never  Vaping Use   Vaping Use: Never used  Substance and Sexual Activity   Alcohol use: No   Drug use: No   Sexual activity: Not on file  Other Topics Concern   Not on file  Social History Narrative   Not on file   Social Determinants of Health   Financial Resource Strain: Not on file  Food Insecurity: Not on file  Transportation Needs: Not on file  Physical Activity: Not on file  Stress: Not on file  Social Connections: Not on  file  Intimate Partner Violence: Not on file    Outpatient Medications Prior to Visit  Medication Sig Dispense Refill   ACCU-CHEK AVIVA PLUS test strip USE AS INSTRUCTED 100 each 1   ACCU-CHEK SOFTCLIX LANCETS lancets Pt checks blood sugars twice per day. DX: E11.9 100 each 3   donepezil (ARICEPT) 10 MG tablet Take 1 tablet (10 mg total) by mouth at bedtime. 90 tablet 3   hydrochlorothiazide (HYDRODIURIL) 25 MG tablet TAKE 1/2 TABLET BY MOUTH DAILY 90 tablet 0   Ketotifen Fumarate (ZADITOR OP) Apply 0.035 % to eye 2 (two) times daily.      metFORMIN (GLUCOPHAGE) 500 MG tablet TAKE 2 TABLETS BY MOUTH TWICE DAILY WITH BREAKFAST AND SUPPER 360 tablet 3   multivitamin-iron-minerals-folic acid (CENTRUM) chewable tablet Chew 1 tablet by mouth daily.     simvastatin (ZOCOR) 40 MG tablet TAKE 1 TABLET BY MOUTH EVERY DAY 90 tablet 3   amLODipine (NORVASC) 5 MG tablet Take 1 tablet (5 mg total) by mouth daily. 30 tablet 1   No facility-administered medications prior to visit.    No Known Allergies  ROS Review of Systems  Constitutional:  Negative for fatigue.  Eyes:  Negative for visual disturbance.  Respiratory:  Negative for cough, chest tightness, shortness of breath and wheezing.  Cardiovascular:  Negative for chest pain, palpitations and leg swelling.  Endocrine: Negative for polydipsia and polyuria.  Neurological:  Negative for dizziness, seizures, syncope, weakness, light-headedness and headaches.     Objective:    Physical Exam Constitutional:      Appearance: She is well-developed.  Eyes:     Pupils: Pupils are equal, round, and reactive to light.  Neck:     Thyroid: No thyromegaly.     Vascular: No JVD.  Cardiovascular:     Rate and Rhythm: Normal rate and regular rhythm.     Heart sounds:    No gallop.  Pulmonary:     Effort: Pulmonary effort is normal. No respiratory distress.     Breath sounds: Normal breath sounds. No wheezing or rales.  Musculoskeletal:      Cervical back: Neck supple.  Neurological:     Mental Status: She is alert.    BP 140/80 (BP Location: Left Arm, Patient Position: Sitting, Cuff Size: Normal)   Pulse 89   Temp 98.3 F (36.8 C) (Oral)   Wt 180 lb 12.8 oz (82 kg)   SpO2 95%   BMI 34.16 kg/m  Wt Readings from Last 3 Encounters:  04/02/21 180 lb 12.8 oz (82 kg)  03/04/21 184 lb (83.5 kg)  09/04/20 192 lb (87.1 kg)     Health Maintenance Due  Topic Date Due   TETANUS/TDAP  Never done   Zoster Vaccines- Shingrix (1 of 2) Never done   DEXA SCAN  Never done   FOOT EXAM  06/25/2018   OPHTHALMOLOGY EXAM  10/15/2020   COVID-19 Vaccine (4 - Booster for Moderna series) 12/15/2020   URINE MICROALBUMIN  02/01/2021    There are no preventive care reminders to display for this patient.  Lab Results  Component Value Date   TSH 4.40 06/04/2020   Lab Results  Component Value Date   WBC 5.4 02/02/2020   HGB 14.1 02/02/2020   HCT 43.0 02/02/2020   MCV 84.7 02/02/2020   PLT 313.0 02/02/2020   Lab Results  Component Value Date   NA 139 02/02/2020   K 4.5 02/02/2020   CO2 30 02/02/2020   GLUCOSE 128 (H) 02/02/2020   BUN 8 02/02/2020   CREATININE 0.71 02/02/2020   BILITOT 0.8 02/02/2020   ALKPHOS 78 02/02/2020   AST 19 02/02/2020   ALT 24 02/02/2020   PROT 7.7 02/02/2020   ALBUMIN 4.4 02/02/2020   CALCIUM 10.6 (H) 02/02/2020   GFR 95.30 02/02/2020   Lab Results  Component Value Date   CHOL 218 (H) 06/04/2020   Lab Results  Component Value Date   HDL 64 06/04/2020   Lab Results  Component Value Date   LDLCALC 134 (H) 06/04/2020   Lab Results  Component Value Date   TRIG 94 06/04/2020   Lab Results  Component Value Date   CHOLHDL 3.4 06/04/2020   Lab Results  Component Value Date   HGBA1C 7.1 (A) 03/04/2021      Assessment & Plan:   #1 hypertension improved.  She had recent systolic reading 322 and today 140-142 systolic with recent addition of amlodipine -Continue amlodipine 5 mg daily  with prescription written for 1 year -Continue HCTZ -Check basic metabolic panel  #2 dyslipidemia treated with simvastatin -Check liver and hepatic panel -Continue current dose of simvastatin  #3 type 2 diabetes fairly well controlled by recent labs -Continue low glycemic diet. -Recheck A1c at 63-month follow-up   Meds ordered this encounter  Medications  amLODipine (NORVASC) 5 MG tablet    Sig: Take 1 tablet (5 mg total) by mouth daily.    Dispense:  90 tablet    Refill:  3    Follow-up: Return in about 4 months (around 08/02/2021).    Evelena Peat, MD

## 2021-04-04 NOTE — Progress Notes (Signed)
ATC, unable to leave a message.  

## 2021-04-26 ENCOUNTER — Other Ambulatory Visit: Payer: Self-pay | Admitting: Family Medicine

## 2021-05-13 ENCOUNTER — Other Ambulatory Visit: Payer: Self-pay

## 2021-05-13 ENCOUNTER — Ambulatory Visit (INDEPENDENT_AMBULATORY_CARE_PROVIDER_SITE_OTHER): Payer: Medicare HMO

## 2021-05-13 DIAGNOSIS — Z0000 Encounter for general adult medical examination without abnormal findings: Secondary | ICD-10-CM

## 2021-05-13 NOTE — Progress Notes (Signed)
Virtual Visit via Telephone Note  I connected with  [name] on 05/13/21 at  1:00 PM EDT by telephone and verified that I am speaking with the correct person using two identifiers.  Medicare Annual Wellness visit completed telephonically due to Covid-19 pandemic.   Persons participating in this call: This Health Coach and this patient.  Location: Patient: home Provider: office   I discussed the limitations, risks, security and privacy concerns of performing an evaluation and management service by telephone and the availability of in person appointments. The patient expressed understanding and agreed to proceed.  Unable to perform video visit due to video visit attempted and failed and/or patient does not have video capability.   Some vital signs may be absent or patient reported.   Marzella Schlein, LPN   Subjective:   [name] is a 85 y.o. female who presents for Medicare Annual (Subsequent) preventive examination.  Review of Systems     Cardiac Risk Factors include: advanced age (>27men, >40 women);hypertension;diabetes mellitus;dyslipidemia;obesity (BMI >30kg/m2)     Objective:    There were no vitals filed for this visit. There is no height or weight on file to calculate BMI.  Advanced Directives 05/13/2021 06/25/2017  Does Patient Have a Medical Advance Directive? No (No Data)  Would patient like information on creating a medical advance directive? No - Patient declined -    Current Medications (verified) Outpatient Encounter Medications as of 05/13/2021  Medication Sig   ACCU-CHEK AVIVA PLUS test strip USE AS INSTRUCTED   ACCU-CHEK SOFTCLIX LANCETS lancets Pt checks blood sugars twice per day. DX: E11.9   amLODipine (NORVASC) 5 MG tablet Take 1 tablet (5 mg total) by mouth daily.   donepezil (ARICEPT) 10 MG tablet Take 1 tablet (10 mg total) by mouth at bedtime.   hydrochlorothiazide (HYDRODIURIL) 25 MG tablet TAKE 1/2 TABLET BY MOUTH DAILY   Ketotifen  Fumarate (ZADITOR OP) Apply 0.035 % to eye 2 (two) times daily.    metFORMIN (GLUCOPHAGE) 500 MG tablet TAKE 2 TABLETS BY MOUTH TWICE DAILY WITH BREAKFAST AND SUPPER   multivitamin-iron-minerals-folic acid (CENTRUM) chewable tablet Chew 1 tablet by mouth daily.   simvastatin (ZOCOR) 40 MG tablet TAKE 1 TABLET BY MOUTH EVERY DAY   No facility-administered encounter medications on file as of 05/13/2021.    Allergies (verified) Patient has no known allergies.   History: Past Medical History:  Diagnosis Date   DEGENERATIVE JOINT DISEASE 08/25/2010   HYPERLIPIDEMIA 03/08/2009   HYPERTENSION 03/08/2009   Obesity    PREDIABETES 08/23/2009   Renal calculi    Past Surgical History:  Procedure Laterality Date   TONSILLECTOMY     Family History  Problem Relation Age of Onset   Diabetes Father    Social History   Socioeconomic History   Marital status: Single    Spouse name: Not on file   Number of children: Not on file   Years of education: Not on file   Highest education level: Not on file  Occupational History   Not on file  Tobacco Use   Smoking status: Never   Smokeless tobacco: Never  Vaping Use   Vaping Use: Never used  Substance and Sexual Activity   Alcohol use: No   Drug use: No   Sexual activity: Not on file  Other Topics Concern   Not on file  Social History Narrative   Not on file   Social Determinants of Health   Financial Resource Strain: Low Risk  Difficulty of Paying Living Expenses: Not hard at all  Food Insecurity: No Food Insecurity   Worried About Running Out of Food in the Last Year: Never true   Ran Out of Food in the Last Year: Never true  Transportation Needs: No Transportation Needs   Lack of Transportation (Medical): No   Lack of Transportation (Non-Medical): No  Physical Activity: Inactive   Days of Exercise per Week: 0 days   Minutes of Exercise per Session: 0 min  Stress: No Stress Concern Present   Feeling of Stress : Not at all   Social Connections: Moderately Isolated   Frequency of Communication with Friends and Family: More than three times a week   Frequency of Social Gatherings with Friends and Family: More than three times a week   Attends Religious Services: More than 4 times per year   Active Member of [employer] or Organizations: No   Attends Engineer, structural: Never   Marital Status: Never married    Tobacco Counseling Counseling given: Not Answered   Clinical Intake:  Pre-visit preparation completed: Yes  Pain : No/denies pain     BMI - recorded: 34.16 Nutritional Status: BMI > 30  Obese Nutritional Risks: None Diabetes: Yes CBG done?: No Did pt. bring in CBG monitor from home?: No  How often do you need to have someone help you when you read instructions, pamphlets, or other written materials from your doctor or pharmacy?: 1 - Never  Diabetic?Nutrition Risk Assessment:  Has the patient had any N/V/D within the last 2 months?  No  Does the patient have any non-healing wounds?  No  Has the patient had any unintentional weight loss or weight gain?  No   Diabetes:  Is the patient diabetic?  Yes  If diabetic, was a CBG obtained today?  No  Did the patient bring in their glucometer from home?  No  How often do you monitor your CBG's? N/A.   Financial Strains and Diabetes Management:  Are you having any financial strains with the device, your supplies or your medication? No .  Does the patient want to be seen by Chronic Care Management for management of their diabetes?  No  Would the patient like to be referred to a Nutritionist or for Diabetic Management?  No   Diabetic Exams:  Diabetic Eye Exam: Overdue for diabetic eye exam. Pt has been advised about the importance in completing this exam. Patient advised to call and schedule an eye exam. Diabetic Foot Exam: Overdue, Pt has been advised about the importance in completing this exam. Pt is scheduled for diabetic foot exam on  next appt.   Interpreter Needed?: No  Information entered by :: Lanier Ensign, LPN   Activities of Daily Living In your present state of health, do you have any difficulty performing the following activities: 05/13/2021  Hearing? N  Vision? N  Difficulty concentrating or making decisions? N  Walking or climbing stairs? N  Dressing or bathing? N  Doing errands, shopping? N  Preparing Food and eating ? N  Using the Toilet? N  In the past six months, have you accidently leaked urine? N  Do you have problems with loss of bowel control? N  Managing your Medications? N  Managing your Finances? N  Housekeeping or managing your Housekeeping? N  Some recent data might be hidden    Patient Care Team: Kristian Covey, MD as PCP - General  Indicate any recent Medical Services you may have  received from other than [facility] providers in the past year (date may be approximate).     Assessment:   This is a routine wellness examination for [place].  Hearing/Vision screen Hearing Screening - Comments:: Denies any hearing issues Vision Screening - Comments:: Pt follows up with Dr Clydene Pugh  for annual eye exams   Dietary issues and exercise activities discussed: Current Exercise Habits: The patient does not participate in regular exercise at present   Goals Addressed             This Visit's Progress    Patient Stated       Lose weight        Depression Screen PHQ 2/9 Scores 05/13/2021 02/02/2020 07/12/2018 06/25/2017 08/24/2016 03/03/2015 03/01/2015  PHQ - 2 Score 0 0 0 0 0 0 0    Fall Risk Fall Risk  05/13/2021 07/12/2018 06/25/2017 08/24/2016 06/15/2016  Falls in the past year? 0 No No No No  Comment - - - - Emmi Telephone Survey: data to providers prior to load  Number falls in past yr: 0 - - - -  Injury with Fall? 0 - - - -  Risk for fall due to : Impaired vision - - - -  Follow up Falls prevention discussed - - - -    FALL RISK PREVENTION PERTAINING TO THE HOME:  Any stairs  in or around the home? Yes  If so, are there any without handrails? No  Home free of loose throw rugs in walkways, pet beds, electrical cords, etc? Yes  Adequate lighting in your home to reduce risk of falls? Yes   ASSISTIVE DEVICES UTILIZED TO PREVENT FALLS:  Life alert? No  Use of a cane, walker or w/c? Yes  Grab bars in the bathroom? No  Shower chair or bench in shower? No  Elevated toilet seat or a handicapped toilet? No   TIMED UP AND GO:  Was the test performed? No .    Cognitive Function: MMSE - Mini Mental State Exam 06/25/2017  Not completed: Refused     6CIT Screen 05/13/2021  What Year? 0 points  What month? 0 points  What time? 0 points  Count back from 20 0 points  Months in reverse 4 points  Repeat phrase 10 points  Total Score 14    Immunizations Immunization History  Administered Date(s) Administered   Fluad Quad(high Dose 65+) 09/04/2020   Influenza Split 08/27/2011, 09/01/2012   Influenza Whole 08/23/2009, 08/25/2010   Influenza, High Dose Seasonal PF 09/01/2013, 08/30/2015, 08/24/2016, 06/25/2017, 06/28/2018   Influenza,inj,Quad PF,6+ Mos 08/05/2019   Influenza-Unspecified 08/04/2014   Moderna Sars-Covid-2 Vaccination 12/21/2019, 01/26/2020, 08/14/2020   Pneumococcal Conjugate-13 03/01/2014   Pneumococcal Polysaccharide-23 10/20/2003    TDAP status: Due, Education has been provided regarding the importance of this vaccine. Advised may receive this vaccine at local pharmacy or Health Dept. Aware to provide a copy of the vaccination record if obtained from local pharmacy or Health Dept. Verbalized acceptance and understanding.  Flu Vaccine status: Up to date  Pneumococcal vaccine status: Up to date  Covid-19 vaccine status: Completed vaccines  Qualifies for Shingles Vaccine? Yes   Zostavax completed No   Shingrix Completed?: No.    Education has been provided regarding the importance of this vaccine. Patient has been advised to call insurance  company to determine out of pocket expense if they have not yet received this vaccine. Advised may also receive vaccine at local pharmacy or Health Dept. Verbalized acceptance and understanding.  Screening Tests Health Maintenance  Topic Date Due   TETANUS/TDAP  Never done   Zoster Vaccines- Shingrix (1 of 2) Never done   FOOT EXAM  06/25/2018   OPHTHALMOLOGY EXAM  10/15/2020   COVID-19 Vaccine (4 - Booster for Moderna series) 12/15/2020   URINE MICROALBUMIN  02/01/2021   DEXA SCAN  05/13/2022 (Originally 11/05/2002)   INFLUENZA VACCINE  05/19/2021   HEMOGLOBIN A1C  09/04/2021   PNA vac Low Risk Adult  Completed   HPV VACCINES  Aged Out    Health Maintenance  Health Maintenance Due  Topic Date Due   TETANUS/TDAP  Never done   Zoster Vaccines- Shingrix (1 of 2) Never done   FOOT EXAM  06/25/2018   OPHTHALMOLOGY EXAM  10/15/2020   COVID-19 Vaccine (4 - Booster for Moderna series) 12/15/2020   URINE MICROALBUMIN  02/01/2021    Colorectal cancer screening: No longer required.   Mammogram status: No longer required due to age.    Additional Screening:   Vision Screening: Recommended annual ophthalmology exams for early detection of glaucoma and other disorders of the eye. Is the patient up to date with their annual eye exam?  No  Who is the provider or what is the name of the office in which the patient attends annual eye exams? Dr [place]dene [place]  If pt is not established with a provider, would they like to be referred to a provider to establish care? No .   Dental Screening: Recommended annual dental exams for proper oral hygiene  Community Resource Referral / Chronic Care Management: CRR required this visit?  No   CCM required this visit?  No      Plan:     I have personally reviewed and noted the following in the patient's chart:   Medical and social history Use of alcohol, tobacco or illicit drugs  Current medications and supplements including opioid  prescriptions.  Functional ability and status Nutritional status Physical activity Advanced directives List of other physicians Hospitalizations, surgeries, and ER visits in previous 12 months Vitals Screenings to include cognitive, depression, and falls Referrals and appointments  In addition, I have reviewed and discussed with patient certain preventive protocols, quality metrics, and best practice recommendations. A written personalized care plan for preventive services as well as general preventive health recommendations were provided to patient.     Marzella Schleinina H Joe Tanney, LPN   [id number]/26/2022   Nurse Notes: None

## 2021-05-13 NOTE — Patient Instructions (Signed)
Ms. Rigdon , Thank you for taking time to come for your Medicare Wellness Visit. I appreciate your ongoing commitment to your health goals. Please review the following plan we discussed and let me know if I can assist you in the future.   Screening recommendations/referrals: Colonoscopy: No longer required  Mammogram: No Longer required  Bone Density: Declined at this time Recommended yearly ophthalmology/optometry visit for glaucoma screening and checkup Recommended yearly dental visit for hygiene and checkup  Vaccinations: Influenza vaccine: Up to date Pneumococcal vaccine: Completed  Tdap vaccine: Due and discussed Shingles vaccine: Shingrix discussed. Please contact your pharmacy for coverage information.    Covid-19:Completed 3/4, 4/9, 08/14/20  Advanced directives: Advance directive discussed with you today. Even though you declined this today please call our office should you change your mind and we can give you the proper paperwork for you to fill out.  Conditions/risks identified: lose weight   Next appointment: Follow up in one year for your annual wellness visit    Preventive Care 65 Years and Older, Female Preventive care refers to lifestyle choices and visits with your health care provider that can promote health and wellness. What does preventive care include? A yearly physical exam. This is also called an annual well check. Dental exams once or twice a year. Routine eye exams. Ask your health care provider how often you should have your eyes checked. Personal lifestyle choices, including: Daily care of your teeth and gums. Regular physical activity. Eating a healthy diet. Avoiding tobacco and drug use. Limiting alcohol use. Practicing safe sex. Taking low-dose aspirin every day. Taking vitamin and mineral supplements as recommended by your health care provider. What happens during an annual well check? The services and screenings done by your health care  provider during your annual well check will depend on your age, overall health, lifestyle risk factors, and family history of disease. Counseling  Your health care provider may ask you questions about your: Alcohol use. Tobacco use. Drug use. Emotional well-being. Home and relationship well-being. Sexual activity. Eating habits. History of falls. Memory and ability to understand (cognition). Work and work Astronomer. Reproductive health. Screening  You may have the following tests or measurements: Height, weight, and BMI. Blood pressure. Lipid and cholesterol levels. These may be checked every 5 years, or more frequently if you are over 44 years old. Skin check. Lung cancer screening. You may have this screening every year starting at age 107 if you have a 30-pack-year history of smoking and currently smoke or have quit within the past 15 years. Fecal occult blood test (FOBT) of the stool. You may have this test every year starting at age 44. Flexible sigmoidoscopy or colonoscopy. You may have a sigmoidoscopy every 5 years or a colonoscopy every 10 years starting at age 41. Hepatitis C blood test. Hepatitis B blood test. Sexually transmitted disease (STD) testing. Diabetes screening. This is done by checking your blood sugar (glucose) after you have not eaten for a while (fasting). You may have this done every 1-3 years. Bone density scan. This is done to screen for osteoporosis. You may have this done starting at age 67. Mammogram. This may be done every 1-2 years. Talk to your health care provider about how often you should have regular mammograms. Talk with your health care provider about your test results, treatment options, and if necessary, the need for more tests. Vaccines  Your health care provider may recommend certain vaccines, such as: Influenza vaccine. This is recommended every year.  Tetanus, diphtheria, and acellular pertussis (Tdap, Td) vaccine. You may need a Td  booster every 10 years. Zoster vaccine. You may need this after age 25. Pneumococcal 13-valent conjugate (PCV13) vaccine. One dose is recommended after age 33. Pneumococcal polysaccharide (PPSV23) vaccine. One dose is recommended after age 21. Talk to your health care provider about which screenings and vaccines you need and how often you need them. This information is not intended to replace advice given to you by your health care provider. Make sure you discuss any questions you have with your health care provider. Document Released: 11/01/2015 Document Revised: 06/24/2016 Document Reviewed: 08/06/2015 Elsevier Interactive Patient Education  2017 [place] Prevention in the Home Falls can cause injuries. They can happen to people of all ages. There are many things you can do to make your home safe and to help prevent falls. What can I do on the outside of my home? Regularly fix the edges of walkways and driveways and fix any cracks. Remove anything that might make you trip as you walk through a door, such as a raised step or threshold. Trim any bushes or trees on the path to your home. Use bright outdoor lighting. Clear any walking paths of anything that might make someone trip, such as rocks or tools. Regularly check to see if handrails are loose or broken. Make sure that both sides of any steps have handrails. Any raised decks and porches should have guardrails on the edges. Have any leaves, snow, or ice cleared regularly. Use sand or salt on walking paths during winter. Clean up any spills in your garage right away. This includes oil or grease spills. What can I do in the bathroom? Use night lights. Install grab bars by the toilet and in the tub and shower. Do not use towel bars as grab bars. Use non-skid mats or decals in the tub or shower. If you need to sit down in the shower, use a plastic, non-slip stool. Keep the floor dry. Clean up any water that spills on the floor  as soon as it happens. Remove soap buildup in the tub or shower regularly. Attach bath mats securely with double-sided non-slip rug tape. Do not have throw rugs and other things on the floor that can make you trip. What can I do in the bedroom? Use night lights. Make sure that you have a light by your bed that is easy to reach. Do not use any sheets or blankets that are too big for your bed. They should not hang down onto the floor. Have a firm chair that has side arms. You can use this for support while you get dressed. Do not have throw rugs and other things on the floor that can make you trip. What can I do in the kitchen? Clean up any spills right away. Avoid walking on wet floors. Keep items that you use a lot in easy-to-reach places. If you need to reach something above you, use a strong step stool that has a grab bar. Keep electrical cords out of the way. Do not use floor polish or wax that makes floors slippery. If you must use wax, use non-skid floor wax. Do not have throw rugs and other things on the floor that can make you trip. What can I do with my stairs? Do not leave any items on the stairs. Make sure that there are handrails on both sides of the stairs and use them. Fix handrails that are broken or loose.  Make sure that handrails are as long as the stairways. Check any carpeting to make sure that it is firmly attached to the stairs. Fix any carpet that is loose or worn. Avoid having throw rugs at the top or bottom of the stairs. If you do have throw rugs, attach them to the floor with carpet tape. Make sure that you have a light switch at the top of the stairs and the bottom of the stairs. If you do not have them, ask someone to add them for you. What else can I do to help prevent falls? Wear shoes that: Do not have high heels. Have rubber bottoms. Are comfortable and fit you well. Are closed at the toe. Do not wear sandals. If you use a stepladder: Make sure that it is  fully opened. Do not climb a closed stepladder. Make sure that both sides of the stepladder are locked into place. Ask someone to hold it for you, if possible. Clearly mark and make sure that you can see: Any grab bars or handrails. First and last steps. Where the edge of each step is. Use tools that help you move around (mobility aids) if they are needed. These include: Canes. Walkers. Scooters. Crutches. Turn on the lights when you go into a dark area. Replace any light bulbs as soon as they burn out. Set up your furniture so you have a clear path. Avoid moving your furniture around. If any of your floors are uneven, fix them. If there are any pets around you, be aware of where they are. Review your medicines with your doctor. Some medicines can make you feel dizzy. This can increase your chance of falling. Ask your doctor what other things that you can do to help prevent falls. This information is not intended to replace advice given to you by your health care provider. Make sure you discuss any questions you have with your health care provider. Document Released: 08/01/2009 Document Revised: 03/12/2016 Document Reviewed: 11/09/2014 Elsevier Interactive Patient Education  2017 [employer].

## 2021-06-16 ENCOUNTER — Other Ambulatory Visit: Payer: Self-pay | Admitting: Family Medicine

## 2021-10-08 DIAGNOSIS — H524 Presbyopia: Secondary | ICD-10-CM | POA: Diagnosis not present

## 2021-10-08 DIAGNOSIS — E119 Type 2 diabetes mellitus without complications: Secondary | ICD-10-CM | POA: Diagnosis not present

## 2021-10-08 DIAGNOSIS — Z0100 Encounter for examination of eyes and vision without abnormal findings: Secondary | ICD-10-CM | POA: Diagnosis not present

## 2021-10-16 ENCOUNTER — Other Ambulatory Visit: Payer: Self-pay | Admitting: Family Medicine

## 2022-03-30 ENCOUNTER — Other Ambulatory Visit: Payer: Self-pay | Admitting: Family Medicine

## 2022-04-16 ENCOUNTER — Other Ambulatory Visit: Payer: Self-pay | Admitting: Family Medicine

## 2022-05-19 ENCOUNTER — Telehealth: Payer: Self-pay

## 2022-05-19 ENCOUNTER — Ambulatory Visit: Payer: Medicare HMO

## 2022-05-19 NOTE — Telephone Encounter (Signed)
This nurse attempted to call patient three times for telephonic AWV. Mailbox is full and I was unable to leave a message.

## 2022-05-20 ENCOUNTER — Ambulatory Visit: Payer: Medicare HMO

## 2022-05-24 ENCOUNTER — Other Ambulatory Visit: Payer: Self-pay | Admitting: Family Medicine

## 2022-05-25 ENCOUNTER — Other Ambulatory Visit: Payer: Self-pay | Admitting: Family Medicine

## 2022-06-04 ENCOUNTER — Ambulatory Visit (INDEPENDENT_AMBULATORY_CARE_PROVIDER_SITE_OTHER): Payer: Medicare PPO

## 2022-06-04 VITALS — Ht 64.0 in

## 2022-06-04 DIAGNOSIS — Z0000 Encounter for general adult medical examination without abnormal findings: Secondary | ICD-10-CM | POA: Diagnosis not present

## 2022-06-04 NOTE — Patient Instructions (Addendum)
[name] , Thank you for taking time to come for your Medicare Wellness Visit. I appreciate your ongoing commitment to your health goals. Please review the following plan we discussed and let me know if I can assist you in the future.   These are the goals we discussed:  Goals       patient      Do better to control type 2 DM  Today it is 6.8         Patient Stated      Lose weight       Stay alive (pt-stated)        This is a list of the screening recommended for you and due dates:  Health Maintenance  Topic Date Due   Complete foot exam   06/25/2018   Urine Protein Check  02/01/2021   Hemoglobin A1C  09/04/2021   Flu Shot  05/19/2022   Eye exam for diabetics  06/04/2022*   COVID-19 Vaccine (4 - Moderna series) 06/20/2022*   Zoster (Shingles) Vaccine (1 of 2) 09/04/2022*   DEXA scan (bone density measurement)  06/05/2023*   Tetanus Vaccine  06/05/2023*   Pneumonia Vaccine  Completed   HPV Vaccine  Aged Out  *Topic was postponed. The date shown is not the original due date.    Advanced directives: No  Conditions/risks identified: None  Next appointment: Follow up in one year for your annual wellness visit     Preventive Care 65 Years and Older, Female Preventive care refers to lifestyle choices and visits with your health care provider that can promote health and wellness. What does preventive care include? A yearly physical exam. This is also called an annual well check. Dental exams once or twice a year. Routine eye exams. Ask your health care provider how often you should have your eyes checked. Personal lifestyle choices, including: Daily care of your teeth and gums. Regular physical activity. Eating a healthy diet. Avoiding tobacco and drug use. Limiting alcohol use. Practicing safe sex. Taking low-dose aspirin every day. Taking vitamin and mineral supplements as recommended by your health care provider. What happens during an annual well  check? The services and screenings done by your health care provider during your annual well check will depend on your age, overall health, lifestyle risk factors, and family history of disease. Counseling  Your health care provider may ask you questions about your: Alcohol use. Tobacco use. Drug use. Emotional well-being. Home and relationship well-being. Sexual activity. Eating habits. History of falls. Memory and ability to understand (cognition). Work and work Astronomer. Reproductive health. Screening  You may have the following tests or measurements: Height, weight, and BMI. Blood pressure. Lipid and cholesterol levels. These may be checked every 5 years, or more frequently if you are over 55 years old. Skin check. Lung cancer screening. You may have this screening every year starting at age 55 if you have a 30-pack-year history of smoking and currently smoke or have quit within the past 15 years. Fecal occult blood test (FOBT) of the stool. You may have this test every year starting at age 35. Flexible sigmoidoscopy or colonoscopy. You may have a sigmoidoscopy every 5 years or a colonoscopy every 10 years starting at age 48. Hepatitis C blood test. Hepatitis B blood test. Sexually transmitted disease (STD) testing. Diabetes screening. This is done by checking your blood sugar (glucose) after you have not eaten for a while (fasting). You may have this done every 1-3 years. Bone density  scan. This is done to screen for osteoporosis. You may have this done starting at age 81. Mammogram. This may be done every 1-2 years. Talk to your health care provider about how often you should have regular mammograms. Talk with your health care provider about your test results, treatment options, and if necessary, the need for more tests. Vaccines  Your health care provider may recommend certain vaccines, such as: Influenza vaccine. This is recommended every year. Tetanus, diphtheria, and  acellular pertussis (Tdap, Td) vaccine. You may need a Td booster every 10 years. Zoster vaccine. You may need this after age 97. Pneumococcal 13-valent conjugate (PCV13) vaccine. One dose is recommended after age 62. Pneumococcal polysaccharide (PPSV23) vaccine. One dose is recommended after age 36. Talk to your health care provider about which screenings and vaccines you need and how often you need them. This information is not intended to replace advice given to you by your health care provider. Make sure you discuss any questions you have with your health care provider. Document Released: 11/01/2015 Document Revised: 06/24/2016 Document Reviewed: 08/06/2015 Elsevier Interactive Patient Education  2017 [employer].  Fall Prevention in the Home Falls can cause injuries. They can happen to people of all ages. There are many things you can do to make your home safe and to help prevent falls. What can I do on the outside of my home? Regularly fix the edges of walkways and driveways and fix any cracks. Remove anything that might make you trip as you walk through a door, such as a raised step or threshold. Trim any bushes or trees on the path to your home. Use bright outdoor lighting. Clear any walking paths of anything that might make someone trip, such as rocks or tools. Regularly check to see if handrails are loose or broken. Make sure that both sides of any steps have handrails. Any raised decks and porches should have guardrails on the edges. Have any leaves, snow, or ice cleared regularly. Use sand or salt on walking paths during winter. Clean up any spills in your garage right away. This includes oil or grease spills. What can I do in the bathroom? Use night lights. Install grab bars by the toilet and in the tub and shower. Do not use towel bars as grab bars. Use non-skid mats or decals in the tub or shower. If you need to sit down in the shower, use a plastic, non-slip stool. Keep  the floor dry. Clean up any water that spills on the floor as soon as it happens. Remove soap buildup in the tub or shower regularly. Attach bath mats securely with double-sided non-slip rug tape. Do not have throw rugs and other things on the floor that can make you trip. What can I do in the bedroom? Use night lights. Make sure that you have a light by your bed that is easy to reach. Do not use any sheets or blankets that are too big for your bed. They should not hang down onto the floor. Have a firm chair that has side arms. You can use this for support while you get dressed. Do not have throw rugs and other things on the floor that can make you trip. What can I do in the kitchen? Clean up any spills right away. Avoid walking on wet floors. Keep items that you use a lot in easy-to-reach places. If you need to reach something above you, use a strong step stool that has a grab bar. Keep electrical  cords out of the way. Do not use floor polish or wax that makes floors slippery. If you must use wax, use non-skid floor wax. Do not have throw rugs and other things on the floor that can make you trip. What can I do with my stairs? Do not leave any items on the stairs. Make sure that there are handrails on both sides of the stairs and use them. Fix handrails that are broken or loose. Make sure that handrails are as long as the stairways. Check any carpeting to make sure that it is firmly attached to the stairs. Fix any carpet that is loose or worn. Avoid having throw rugs at the top or bottom of the stairs. If you do have throw rugs, attach them to the floor with carpet tape. Make sure that you have a light switch at the top of the stairs and the bottom of the stairs. If you do not have them, ask someone to add them for you. What else can I do to help prevent falls? Wear shoes that: Do not have high heels. Have rubber bottoms. Are comfortable and fit you well. Are closed at the toe. Do not  wear sandals. If you use a stepladder: Make sure that it is fully opened. Do not climb a closed stepladder. Make sure that both sides of the stepladder are locked into place. Ask someone to hold it for you, if possible. Clearly mark and make sure that you can see: Any grab bars or handrails. First and last steps. Where the edge of each step is. Use tools that help you move around (mobility aids) if they are needed. These include: Canes. Walkers. Scooters. Crutches. Turn on the lights when you go into a dark area. Replace any light bulbs as soon as they burn out. Set up your furniture so you have a clear path. Avoid moving your furniture around. If any of your floors are uneven, fix them. If there are any pets around you, be aware of where they are. Review your medicines with your doctor. Some medicines can make you feel dizzy. This can increase your chance of falling. Ask your doctor what other things that you can do to help prevent falls. This information is not intended to replace advice given to you by your health care provider. Make sure you discuss any questions you have with your health care provider. Document Released: 08/01/2009 Document Revised: 03/12/2016 Document Reviewed: 11/09/2014 Elsevier Interactive Patient Education  2017 [employer].

## 2022-06-04 NOTE — Progress Notes (Signed)
Subjective:   [name] is a 86 y.o. female who presents for Medicare Annual (Subsequent) preventive examination.  Review of Systems    Virtual Visit via Telephone Note  I connected with  [name] on 06/04/22 at  1:00 PM EDT by telephone and verified that I am speaking with the correct person using two identifiers.  Location: Patient: Home Provider: Office Persons participating in the virtual visit: patient/Nurse Health Advisor   I discussed the limitations, risks, security and privacy concerns of performing an evaluation and management service by telephone and the availability of in person appointments. The patient expressed understanding and agreed to proceed.  Interactive audio and video telecommunications were attempted between this nurse and patient, however failed, due to patient having technical difficulties OR patient did not have access to video capability.  We continued and completed visit with audio only.  Some vital signs may be absent or patient reported.   Joyce Rung, LPN  Cardiac Risk Factors include: advanced age (>29men, >36 women);hypertension     Objective:    Today's Vitals   06/04/22 1309  Height: 5\' 4"  (1.626 m)   Body mass index is 31.03 kg/m.     06/04/2022    1:18 PM 05/13/2021    1:12 PM  Advanced Directives  Does Patient Have a Medical Advance Directive? Yes No  Type of 05/15/2021 of [place];Living will   Does patient want to make changes to medical advance directive? No - Patient declined   Copy of Healthcare Power of Attorney in Chart? No - copy requested   Would patient like information on creating a medical advance directive?  No - Patient declined    Current Medications (verified) Outpatient Encounter Medications as of 06/04/2022  Medication Sig   ACCU-CHEK AVIVA PLUS test strip USE AS INSTRUCTED   ACCU-CHEK SOFTCLIX LANCETS lancets Pt checks blood sugars twice per day. DX: E11.9   amLODipine  (NORVASC) 5 MG tablet Take 1 tablet (5 mg total) by mouth daily.   donepezil (ARICEPT) 10 MG tablet Take 1 tablet (10 mg total) by mouth at bedtime. *appointment required for future refills*   hydrochlorothiazide (HYDRODIURIL) 25 MG tablet TAKE 1/2 TABLET BY MOUTH DAILY   Ketotifen Fumarate (ZADITOR OP) Apply 0.035 % to eye 2 (two) times daily.    metFORMIN (GLUCOPHAGE) 500 MG tablet TAKE 2 TABLETS BY MOUTH TWICE DAILY WITH BREAKFAST AND SUPPER   multivitamin-iron-minerals-folic acid (CENTRUM) chewable tablet Chew 1 tablet by mouth daily.   simvastatin (ZOCOR) 40 MG tablet TAKE 1 TABLET BY MOUTH EVERY DAY   No facility-administered encounter medications on file as of 06/04/2022.    Allergies (verified) Patient has no known allergies.   History: Past Medical History:  Diagnosis Date   DEGENERATIVE JOINT DISEASE 08/25/2010   HYPERLIPIDEMIA 03/08/2009   HYPERTENSION 03/08/2009   Obesity    PREDIABETES 08/23/2009   Renal calculi    Past Surgical History:  Procedure Laterality Date   TONSILLECTOMY     Family History  Problem Relation Age of Onset   Diabetes Father    Social History   Socioeconomic History   Marital status: Single    Spouse name: Not on file   Number of children: Not on file   Years of education: Not on file   Highest education level: Not on file  Occupational History   Not on file  Tobacco Use   Smoking status: Never   Smokeless tobacco: Never  Vaping Use  Vaping Use: Never used  Substance and Sexual Activity   Alcohol use: No   Drug use: No   Sexual activity: Not on file  Other Topics Concern   Not on file  Social History Narrative   Not on file   Social Determinants of Health   Financial Resource Strain: Low Risk  (06/04/2022)   Overall Financial Resource Strain (CARDIA)    Difficulty of Paying Living Expenses: Not hard at all  Food Insecurity: No Food Insecurity (06/04/2022)   Hunger Vital Sign    Worried About Running Out of Food in the Last  Year: Never true    Ran Out of Food in the Last Year: Never true  Transportation Needs: No Transportation Needs (06/04/2022)   PRAPARE - Administrator, Civil Service (Medical): No    Lack of Transportation (Non-Medical): No  Physical Activity: Inactive (06/04/2022)   Exercise Vital Sign    Days of Exercise per Week: 0 days    Minutes of Exercise per Session: 0 min  Stress: No Stress Concern Present (06/04/2022)   [employer] of Occupational Health - Occupational Stress Questionnaire    Feeling of Stress : Not at all  Social Connections: Moderately Integrated (06/04/2022)   Social Connection and Isolation Panel [NHANES]    Frequency of Communication with Friends and Family: More than three times a week    Frequency of Social Gatherings with Friends and Family: More than three times a week    Attends Religious Services: More than 4 times per year    Active Member of [employer] or Organizations: Yes    Attends Engineer, structural: More than 4 times per year    Marital Status: Never married    Tobacco Counseling Counseling given: Not Answered   Clinical Intake: Nutrition Risk Assessment:   Diabetic?  No  Interpreter Needed?: No  Activities of Daily Living    06/04/2022    1:16 PM  In your present state of health, do you have any difficulty performing the following activities:  Hearing? 0  Vision? 0  Difficulty concentrating or making decisions? 0  Walking or climbing stairs? 0  Dressing or bathing? 0  Doing errands, shopping? 0  Preparing Food and eating ? N  Using the Toilet? N  In the past six months, have you accidently leaked urine? N  Do you have problems with loss of bowel control? N  Managing your Medications? N  Managing your Finances? N  Housekeeping or managing your Housekeeping? N    Patient Care Team: Joyce Covey, MD as PCP - General  Indicate any recent Medical Services you may have received from other than [facility] providers in  the past year (date may be approximate).     Assessment:   This is a routine wellness examination for [place].  Hearing/Vision screen Hearing Screening - Comments:: No hearing difficulty Vision Screening - Comments:: Wears glasses.  Dietary issues and exercise activities discussed: Exercise limited by: None identified   Goals Addressed               This Visit's Progress     Stay alive (pt-stated)         Depression Screen    06/04/2022    1:14 PM 05/13/2021    1:11 PM 02/02/2020    1:39 PM 07/12/2018   11:21 AM 06/25/2017   12:46 PM 08/24/2016   11:08 AM 03/03/2015   11:13 AM  PHQ 2/9 Scores  PHQ - 2 Score  0 0 0 0 0 0 0    Fall Risk    06/04/2022    1:17 PM 05/13/2021    1:13 PM 07/12/2018   11:21 AM 06/25/2017   12:46 PM 08/24/2016   11:08 AM  Fall Risk   Falls in the past year? 0 0 No No No  Number falls in past yr: 0 0     Injury with Fall? 0 0     Risk for fall due to : No Fall Risks Impaired vision     Follow up  Falls prevention discussed       FALL RISK PREVENTION PERTAINING TO THE HOME:  Any stairs in or around the home? Yes  If so, are there any without handrails? No  Home free of loose throw rugs in walkways, pet beds, electrical cords, etc? Yes  Adequate lighting in your home to reduce risk of falls? Yes   ASSISTIVE DEVICES UTILIZED TO PREVENT FALLS:  Life alert? No  Use of a cane, walker or w/c? Yes  Grab bars in the bathroom? No  Shower chair or bench in shower? No  Elevated toilet seat or a handicapped toilet? No   TIMED UP AND GO:  Was the test performed? No . Audio Visit   Cognitive Function:    06/25/2017   12:51 PM  MMSE - Mini Mental State Exam  Not completed: Refused        06/04/2022    1:18 PM 05/13/2021    1:14 PM  6CIT Screen  What Year? 4 points 0 points  What month? 0 points 0 points  What time? 0 points 0 points  Count back from 20 2 points 0 points  Months in reverse 4 points 4 points  Repeat phrase 4 points 10 points   Total Score 14 points 14 points    Immunizations Immunization History  Administered Date(s) Administered   Fluad Quad(high Dose 65+) 09/04/2020   Influenza Split 08/27/2011, 09/01/2012   Influenza Whole 08/23/2009, 08/25/2010   Influenza, High Dose Seasonal PF 09/01/2013, 08/30/2015, 08/24/2016, 06/25/2017, 06/28/2018   Influenza,inj,Quad PF,6+ Mos 08/05/2019   Influenza-Unspecified 08/04/2014   Moderna Sars-Covid-2 Vaccination 12/21/2019, 01/26/2020, 08/14/2020   Pneumococcal Conjugate-13 03/01/2014   Pneumococcal Polysaccharide-23 10/20/2003    TDAP status: Due, Education has been provided regarding the importance of this vaccine. Advised may receive this vaccine at local pharmacy or Health Dept. Aware to provide a copy of the vaccination record if obtained from local pharmacy or Health Dept. Verbalized acceptance and understanding.  Flu Vaccine status: Declined, Education has been provided regarding the importance of this vaccine but patient still declined. Advised may receive this vaccine at local pharmacy or Health Dept. Aware to provide a copy of the vaccination record if obtained from local pharmacy or Health Dept. Verbalized acceptance and understanding.  Pneumococcal vaccine status: Completed during today's visit.  Covid-19 vaccine status: Information provided on how to obtain vaccines.   Qualifies for Shingles Vaccine? Yes   Zostavax completed No   Shingrix Completed?: No.    Education has been provided regarding the importance of this vaccine. Patient has been advised to call insurance company to determine out of pocket expense if they have not yet received this vaccine. Advised may also receive vaccine at local pharmacy or Health Dept. Verbalized acceptance and understanding.  Screening Tests Health Maintenance  Topic Date Due   FOOT EXAM  06/25/2018   URINE MICROALBUMIN  02/01/2021   HEMOGLOBIN A1C  09/04/2021   INFLUENZA VACCINE  05/19/2022   OPHTHALMOLOGY EXAM   06/04/2022 (Originally 10/15/2020)   COVID-19 Vaccine (4 - Moderna series) 06/20/2022 (Originally 10/09/2020)   Zoster Vaccines- Shingrix (1 of 2) 09/04/2022 (Originally 11/06/1987)   DEXA SCAN  06/05/2023 (Originally 11/05/2002)   TETANUS/TDAP  06/05/2023 (Originally 11/05/1956)   Pneumonia Vaccine 2+ Years old  Completed   HPV VACCINES  Aged Out    Health Maintenance  Health Maintenance Due  Topic Date Due   FOOT EXAM  06/25/2018   URINE MICROALBUMIN  02/01/2021   HEMOGLOBIN A1C  09/04/2021   INFLUENZA VACCINE  05/19/2022    Colorectal cancer screening: No longer required.   Mammogram status: No longer required due to Age.  Bone Density status: Ordered Patient deferred. Pt provided with contact info and advised to call to schedule appt.  Lung Cancer Screening: (Low Dose CT Chest recommended if Age 2-80 years, 30 pack-year currently smoking OR have quit w/in 15years.) does not qualify.     Additional Screening:  Hepatitis C Screening: does not qualify; Completed    Vision Screening: Recommended annual ophthalmology exams for early detection of glaucoma and other disorders of the eye. Is the patient up to date with their annual eye exam?  No  Who is the provider or what is the name of the office in which the patient attends annual eye exams? Patient deferred If pt is not established with a provider, would they like to be referred to a provider to establish care? No .   Dental Screening: Recommended annual dental exams for proper oral hygiene  Community Resource Referral / Chronic Care Management:  CRR required this visit?  No   CCM required this visit?  No      Plan:     I have personally reviewed and noted the following in the patient's chart:   Medical and social history Use of alcohol, tobacco or illicit drugs  Current medications and supplements including opioid prescriptions.  Functional ability and status Nutritional status Physical activity Advanced  directives List of other physicians Hospitalizations, surgeries, and ER visits in previous 12 months Vitals Screenings to include cognitive, depression, and falls Referrals and appointments  In addition, I have reviewed and discussed with patient certain preventive protocols, quality metrics, and best practice recommendations. A written personalized care plan for preventive services as well as general preventive health recommendations were provided to patient.     Joyce Rung, LPN   1/61/0960   Nurse Notes: Patient due labs Hemoglobin A1C and Urine Microalbumin

## 2022-07-02 ENCOUNTER — Other Ambulatory Visit: Payer: Self-pay | Admitting: Family Medicine

## 2022-07-03 ENCOUNTER — Other Ambulatory Visit: Payer: Self-pay | Admitting: Family Medicine

## 2022-10-20 ENCOUNTER — Other Ambulatory Visit: Payer: Self-pay | Admitting: Family Medicine

## 2022-10-23 ENCOUNTER — Other Ambulatory Visit: Payer: Self-pay | Admitting: Family Medicine

## 2022-11-16 ENCOUNTER — Other Ambulatory Visit: Payer: Self-pay | Admitting: Family Medicine

## 2022-11-17 ENCOUNTER — Other Ambulatory Visit: Payer: Self-pay | Admitting: Family Medicine

## 2022-11-23 ENCOUNTER — Other Ambulatory Visit: Payer: Self-pay | Admitting: Family Medicine

## 2022-11-24 ENCOUNTER — Other Ambulatory Visit: Payer: Self-pay | Admitting: Family Medicine

## 2022-12-01 ENCOUNTER — Other Ambulatory Visit: Payer: Self-pay | Admitting: Family Medicine

## 2022-12-21 ENCOUNTER — Other Ambulatory Visit: Payer: Self-pay | Admitting: Family Medicine

## 2022-12-24 ENCOUNTER — Other Ambulatory Visit: Payer: Self-pay | Admitting: Family Medicine

## 2022-12-24 ENCOUNTER — Telehealth: Payer: Self-pay | Admitting: Family Medicine

## 2022-12-24 MED ORDER — METFORMIN HCL 500 MG PO TABS: ORAL_TABLET | ORAL | 0 refills | Status: DC

## 2022-12-24 NOTE — Telephone Encounter (Signed)
Rx sent 

## 2022-12-24 NOTE — Telephone Encounter (Signed)
Pt caregiver is calling and pt has an appt on 12-30-2022 and would like a refill on metFORMIN (GLUCOPHAGE) 500 MG tablet [id number] - [place], [place] - [address] Phone: [phone]  Fax: [phone]

## 2022-12-30 ENCOUNTER — Ambulatory Visit: Payer: Medicare PPO | Admitting: Family Medicine

## 2022-12-30 ENCOUNTER — Other Ambulatory Visit: Payer: Self-pay | Admitting: Family Medicine

## 2022-12-30 ENCOUNTER — Encounter: Payer: Self-pay | Admitting: Family Medicine

## 2022-12-30 VITALS — BP 160/74 | HR 97 | Temp 98.0°F | Ht 64.0 in | Wt 174.8 lb

## 2022-12-30 DIAGNOSIS — G301 Alzheimer's disease with late onset: Secondary | ICD-10-CM | POA: Diagnosis not present

## 2022-12-30 DIAGNOSIS — F02C0 Dementia in other diseases classified elsewhere, severe, without behavioral disturbance, psychotic disturbance, mood disturbance, and anxiety: Secondary | ICD-10-CM | POA: Diagnosis not present

## 2022-12-30 DIAGNOSIS — I10 Essential (primary) hypertension: Secondary | ICD-10-CM | POA: Diagnosis not present

## 2022-12-30 DIAGNOSIS — E785 Hyperlipidemia, unspecified: Secondary | ICD-10-CM | POA: Diagnosis not present

## 2022-12-30 DIAGNOSIS — R011 Cardiac murmur, unspecified: Secondary | ICD-10-CM | POA: Diagnosis not present

## 2022-12-30 DIAGNOSIS — E119 Type 2 diabetes mellitus without complications: Secondary | ICD-10-CM | POA: Diagnosis not present

## 2022-12-30 LAB — LIPID PANEL
Cholesterol: 240 mg/dL — ABNORMAL HIGH (ref 0–200)
HDL: 53.7 mg/dL (ref >39.00)
LDL Cholesterol: 157 mg/dL — ABNORMAL HIGH (ref 0–99)
NonHDL: 186.64
Total CHOL/HDL Ratio: 4
Triglycerides: 147 mg/dL (ref 0.0–149.0)
VLDL: 29.4 mg/dL (ref 0.0–40.0)

## 2022-12-30 LAB — BASIC METABOLIC PANEL
BUN: 15 mg/dL (ref 6–23)
CO2: 28 mEq/L (ref 19–32)
Calcium: 10.5 mg/dL (ref 8.4–10.5)
Chloride: 100 mEq/L (ref 96–112)
Creatinine, Ser: 0.92 mg/dL (ref 0.40–1.20)
GFR: 56.92 mL/min — ABNORMAL LOW (ref >60.00)
Glucose, Bld: 180 mg/dL — ABNORMAL HIGH (ref 70–99)
Potassium: 3.8 mEq/L (ref 3.5–5.1)
Sodium: 138 mEq/L (ref 135–145)

## 2022-12-30 LAB — HEPATIC FUNCTION PANEL
ALT: 12 U/L (ref 0–35)
AST: 17 U/L (ref 0–37)
Albumin: 4.4 g/dL (ref 3.5–5.2)
Alkaline Phosphatase: 96 U/L (ref 39–117)
Bilirubin, Direct: 0.1 mg/dL (ref 0.0–0.3)
Total Bilirubin: 0.8 mg/dL (ref 0.2–1.2)
Total Protein: 8.2 g/dL (ref 6.0–8.3)

## 2022-12-30 LAB — HEMOGLOBIN A1C: Hgb A1c MFr Bld: 7.7 % — ABNORMAL HIGH (ref 4.6–6.5)

## 2022-12-30 MED ORDER — METFORMIN HCL 500 MG PO TABS: ORAL_TABLET | ORAL | 3 refills | Status: DC

## 2022-12-30 MED ORDER — AMLODIPINE BESYLATE 5 MG PO TABS: ORAL_TABLET | ORAL | 3 refills | Status: DC

## 2022-12-30 MED ORDER — SIMVASTATIN 40 MG PO TABS: 40.0000 mg | ORAL_TABLET | Freq: Every day | ORAL | 3 refills | Status: DC

## 2022-12-30 MED ORDER — DONEPEZIL HCL 10 MG PO TABS: ORAL_TABLET | ORAL | 1 refills | Status: DC

## 2022-12-30 MED ORDER — HYDROCHLOROTHIAZIDE 25 MG PO TABS: 12.5000 mg | ORAL_TABLET | Freq: Every day | ORAL | 3 refills | Status: DC

## 2022-12-30 NOTE — Patient Instructions (Signed)
Consider home BP cuff and check at least a few times per week.   Be in touch for consistent blood pressures > [id number] systolic.

## 2022-12-30 NOTE — Progress Notes (Signed)
Established Patient Office Visit  Subjective   Patient ID: [name], female    DOB: 1936-04-16  Age: 87 y.o. MRN: [id number]  Chief Complaint  Patient presents with   Medication Consultation    HPI   Ms. Cardosi is here today accompanied by one of her caregivers.  She has regular caregiver during the weekdays 4 hours/day and then someone else staying with her on weekends.  Her medical problems include dementia, hypertension, type 2 diabetes, history of kidney stones, hyperlipidemia.  Most of the history is provided by her caregiver.  They just started overseeing her medications about 2 weeks ago so very likely she was not taking her regular medications up until recently.  Her current medications should include amlodipine 5 mg daily, Aricept 10 mg nightly, HCTZ 25 mg 1/2 tablet daily, metformin 500 mg 2 twice daily, and simvastatin 40 mg daily.  No recent labs. Caregiver relates she has poor appetite at times.  No complaints of any recent pain.  No focal weakness.  No recent falls.  They do not monitor her blood sugars or blood pressure currently at home.  May be eating a lot of sodium.  She frequently eats things like fast food biscuits in the morning.  Past Medical History:  Diagnosis Date   DEGENERATIVE JOINT DISEASE 08/25/2010   HYPERLIPIDEMIA 03/08/2009   HYPERTENSION 03/08/2009   Obesity    PREDIABETES 08/23/2009   Renal calculi    Past Surgical History:  Procedure Laterality Date   TONSILLECTOMY      reports that she has never smoked. She has never used smokeless tobacco. She reports that she does not drink alcohol and does not use drugs. family history includes Diabetes in her father. No Known Allergies  ROS  Limited history obtainable from patient given her advanced dementia   Objective:     BP (!) 160/74 (BP Location: Right Arm, Patient Position: Sitting, Cuff Size: Large)   Pulse 97   Temp 98 F (36.7 C) (Oral)   Ht '5\' 4"'$  (1.626 m)   Wt 174 lb 12.8 oz  (79.3 kg)   SpO2 95%   BMI 30.00 kg/m  BP Readings from Last 3 Encounters:  12/30/22 (!) 160/74  04/02/21 140/80  03/04/21 (!) 178/80   Wt Readings from Last 3 Encounters:  12/30/22 174 lb 12.8 oz (79.3 kg)  04/02/21 180 lb 12.8 oz (82 kg)  03/04/21 184 lb (83.5 kg)      Physical Exam Vitals reviewed.  Constitutional:      General: She is not in acute distress. Cardiovascular:     Rate and Rhythm: Normal rate and regular rhythm.     Heart sounds: Murmur heard.     Comments: She has fairly prominent 3/6 systolic ejection murmur right upper sternal border and also heard along the left sternal border. Pulmonary:     Effort: Pulmonary effort is normal.     Breath sounds: Normal breath sounds. No wheezing or rales.  Musculoskeletal:     Cervical back: Neck supple.     Right lower leg: No edema.     Left lower leg: No edema.  Lymphadenopathy:     Cervical: No cervical adenopathy.  Neurological:     Mental Status: She is alert.      No results found for any visits on 12/30/22.    The ASCVD Risk score (Arnett DK, et al., 2019) failed to calculate for the following reasons:   The 2019 ASCVD risk score is only  valid for ages 24 to 29    Assessment & Plan:   #1 type 2 diabetes.  History of good control.  Currently takes just metformin.  Overdue for labs.  Recheck A1c.  Given her age and advanced dementia would not be aiming for extremely tight control and will be okay with A1c 7-8 range.  We have advised caregiver to watch her intake of high glycemic foods  #2 hypertension with elevated reading today.  Just recently had oversight of her medications.  Discussed with caregiver watching her sodium intake.  We also advised home blood pressure cuff and monitor and be in touch if consistent systolic readings over [id number].  If so could either increase amlodipine or possibly additional medications such as ARB.  Refilled blood pressure medications for 1 year  #3 hyperlipidemia on  simvastatin.  Recheck lipid and hepatic panel  #4 dementia, probably Alzheimer's type.  Clearly has advanced since we had seen her last.  Requiring significant care.  Fortunately, no behavioral disturbance.  Probably not getting much benefit of Aricept at this time and may consider discontinuing at some point soon.    #5 heart murmur.  Suspect she probably has some aortic stenosis.  Caregivers have not noted any significant dyspnea with day-to-day activities.  Would not pursue given her dementia and other comorbidities   Return in about 6 months (around 07/02/2023).    Carolann Littler, MD

## 2023-01-04 ENCOUNTER — Telehealth: Payer: Self-pay | Admitting: Family Medicine

## 2023-01-04 MED ORDER — AMLODIPINE BESYLATE 10 MG PO TABS: ORAL_TABLET | ORAL | 3 refills | Status: DC

## 2023-01-04 NOTE — Telephone Encounter (Signed)
Rx sent and Caregiver informed of the messaege below

## 2023-01-04 NOTE — Telephone Encounter (Signed)
[name] caregiver is calling with BP readings 01-02-2023 163/111, 01-03-2023 146/93 and 01-04-2023 190/106. Pt had no symptoms. Please advise. Pt was last seen on 12-30-2022

## 2023-01-07 ENCOUNTER — Encounter (HOSPITAL_COMMUNITY): Payer: Self-pay

## 2023-01-07 ENCOUNTER — Emergency Department (HOSPITAL_COMMUNITY): Payer: Medicare PPO

## 2023-01-07 ENCOUNTER — Emergency Department (HOSPITAL_COMMUNITY)
Admission: EM | Admit: 2023-01-07 | Discharge: 2023-01-07 | Disposition: A | Discharge status: Home / Self Care | Payer: Medicare PPO | Attending: Emergency Medicine | Admitting: Emergency Medicine

## 2023-01-07 ENCOUNTER — Other Ambulatory Visit: Payer: Self-pay

## 2023-01-07 DIAGNOSIS — W19XXXA Unspecified fall, initial encounter: Secondary | ICD-10-CM | POA: Diagnosis not present

## 2023-01-07 DIAGNOSIS — Z043 Encounter for examination and observation following other accident: Secondary | ICD-10-CM | POA: Diagnosis not present

## 2023-01-07 DIAGNOSIS — S0990XA Unspecified injury of head, initial encounter: Secondary | ICD-10-CM | POA: Diagnosis not present

## 2023-01-07 DIAGNOSIS — R0789 Other chest pain: Secondary | ICD-10-CM | POA: Diagnosis not present

## 2023-01-07 DIAGNOSIS — R531 Weakness: Secondary | ICD-10-CM | POA: Diagnosis not present

## 2023-01-07 DIAGNOSIS — M47812 Spondylosis without myelopathy or radiculopathy, cervical region: Secondary | ICD-10-CM | POA: Diagnosis not present

## 2023-01-07 DIAGNOSIS — R0902 Hypoxemia: Secondary | ICD-10-CM | POA: Diagnosis not present

## 2023-01-07 DIAGNOSIS — I10 Essential (primary) hypertension: Secondary | ICD-10-CM | POA: Insufficient documentation

## 2023-01-07 DIAGNOSIS — E119 Type 2 diabetes mellitus without complications: Secondary | ICD-10-CM | POA: Diagnosis not present

## 2023-01-07 DIAGNOSIS — F03A0 Unspecified dementia, mild, without behavioral disturbance, psychotic disturbance, mood disturbance, and anxiety: Secondary | ICD-10-CM | POA: Insufficient documentation

## 2023-01-07 LAB — URINALYSIS, ROUTINE W REFLEX MICROSCOPIC
Bilirubin Urine: NEGATIVE
Glucose, UA: NEGATIVE mg/dL
Hgb urine dipstick: NEGATIVE
Ketones, ur: 20 mg/dL — AB
Leukocytes,Ua: NEGATIVE
Nitrite: NEGATIVE
Protein, ur: NEGATIVE mg/dL
Specific Gravity, Urine: 1.016 (ref 1.005–1.030)
pH: 5 (ref 5.0–8.0)

## 2023-01-07 LAB — CBC WITH DIFFERENTIAL/PLATELET
Abs Immature Granulocytes: 0.03 10*3/uL (ref 0.00–0.07)
Basophils Absolute: 0.1 10*3/uL (ref 0.0–0.1)
Basophils Relative: 1 %
Eosinophils Absolute: 0.1 10*3/uL (ref 0.0–0.5)
Eosinophils Relative: 1 %
HCT: 50.1 % — ABNORMAL HIGH (ref 36.0–46.0)
Hemoglobin: 15.8 g/dL — ABNORMAL HIGH (ref 12.0–15.0)
Immature Granulocytes: 0 %
Lymphocytes Relative: 18 %
Lymphs Abs: 1.6 10*3/uL (ref 0.7–4.0)
MCH: 26.8 pg (ref 26.0–34.0)
MCHC: 31.5 g/dL (ref 30.0–36.0)
MCV: 85.1 fL (ref 80.0–100.0)
Monocytes Absolute: 1.1 10*3/uL — ABNORMAL HIGH (ref 0.1–1.0)
Monocytes Relative: 12 %
Neutro Abs: 5.8 10*3/uL (ref 1.7–7.7)
Neutrophils Relative %: 68 %
Platelets: 372 10*3/uL (ref 150–400)
RBC: 5.89 MIL/uL — ABNORMAL HIGH (ref 3.87–5.11)
RDW: 14.8 % (ref 11.5–15.5)
WBC: 8.7 10*3/uL (ref 4.0–10.5)
nRBC: 0 % (ref 0.0–0.2)

## 2023-01-07 LAB — COMPREHENSIVE METABOLIC PANEL
ALT: 18 U/L (ref 0–44)
AST: 29 U/L (ref 15–41)
Albumin: 4.5 g/dL (ref 3.5–5.0)
Alkaline Phosphatase: 86 U/L (ref 38–126)
Anion gap: 13 (ref 5–15)
BUN: 20 mg/dL (ref 8–23)
CO2: 26 mmol/L (ref 22–32)
Calcium: 10 mg/dL (ref 8.9–10.3)
Chloride: 98 mmol/L (ref 98–111)
Creatinine, Ser: 0.8 mg/dL (ref 0.44–1.00)
GFR, Estimated: >60 mL/min (ref >60)
Glucose, Bld: 146 mg/dL — ABNORMAL HIGH (ref 70–99)
Potassium: 3.5 mmol/L (ref 3.5–5.1)
Sodium: 137 mmol/L (ref 135–145)
Total Bilirubin: 1.1 mg/dL (ref 0.3–1.2)
Total Protein: 8.5 g/dL — ABNORMAL HIGH (ref 6.5–8.1)

## 2023-01-07 NOTE — ED Triage Notes (Signed)
Pt coming from home with c/o fall. Pt c/o right sided ribcage pain. Pt reports falling last night. Denies blood thinners, LOC, or hitting head. Hx dementia, A&Ox3. Per caretaker patient has been having malodorous urine. Pt denies any urinary frequency, dysuria, or other urinary symptoms.

## 2023-01-07 NOTE — ED Provider Notes (Signed)
[place] AT [facility] Provider Note   CSN: [id number] Arrival date & time: 01/07/23  1053     History  Chief Complaint  Patient presents with   [name] is a 87 y.o. female.  Patient here with unwitnessed fall.  Lives at home by herself.  History of some mild dementia.  She has a caretaker who is with her daily.  Left her last night.  Found her on the floor this morning with a chair that was knocked over as well.  Patient has no complaints.  Caretaker thinks may be some foul-smelling urine.  Denies any fevers or chills.  Not on blood thinners.  Denies any headache, neck pain.  Some right side of the chest wall was hurting before but feeling better now.  The history is provided by the patient.       Home Medications Prior to Admission medications   Medication Sig Start Date End Date Taking? Authorizing Provider  ACCU-CHEK AVIVA PLUS test strip USE AS INSTRUCTED 06/10/16   Burchette, Alinda Sierras, MD  ACCU-CHEK SOFTCLIX LANCETS lancets Pt checks blood sugars twice per day. DX: E11.9 10/22/16   Burchette, Alinda Sierras, MD  amLODipine (NORVASC) 10 MG tablet Take 1 tablet (10 mg total) by mouth once daily. 01/04/23   Burchette, Alinda Sierras, MD  donepezil (ARICEPT) 10 MG tablet TAKE 1 TABLET(10 MG) BY MOUTH AT BEDTIME 12/30/22   Burchette, Alinda Sierras, MD  hydrochlorothiazide (HYDRODIURIL) 25 MG tablet Take 0.5 tablets (12.5 mg total) by mouth daily. 12/30/22   Burchette, Alinda Sierras, MD  Ketotifen Fumarate (ZADITOR OP) Apply 0.035 % to eye 2 (two) times daily.     [provider]  metFORMIN (GLUCOPHAGE) 500 MG tablet TAKE 2 TABLETS BY MOUTH TWICE DAILY WITH BREAKFAST AND SUPPER 12/30/22   Burchette, Alinda Sierras, MD  multivitamin-iron-minerals-folic acid (CENTRUM) chewable tablet Chew 1 tablet by mouth daily.    [provider]  simvastatin (ZOCOR) 40 MG tablet Take 1 tablet (40 mg total) by mouth daily. 12/30/22   Burchette, Alinda Sierras,  MD      Allergies    Patient has no known allergies.    Review of Systems   Review of Systems  Physical Exam Updated Vital Signs BP (!) 151/64   Pulse 80   Resp 19   Ht 5\' 4"  (1.626 m)   SpO2 97%   BMI 30.00 kg/m  Physical Exam Vitals and nursing note reviewed.  Constitutional:      General: She is not in acute distress.    Appearance: She is well-developed. She is not ill-appearing.  HENT:     Head: Normocephalic and atraumatic.     Nose: Nose normal.     Mouth/Throat:     Mouth: Mucous membranes are moist.  Eyes:     Extraocular Movements: Extraocular movements intact.     Conjunctiva/sclera: Conjunctivae normal.     Pupils: Pupils are equal, round, and reactive to light.  Cardiovascular:     Rate and Rhythm: Normal rate and regular rhythm.     Pulses: Normal pulses.     Heart sounds: Normal heart sounds. No murmur heard. Pulmonary:     Effort: Pulmonary effort is normal. No respiratory distress.     Breath sounds: Normal breath sounds.  Abdominal:     Palpations: Abdomen is soft.     Tenderness: There is no abdominal tenderness.  Musculoskeletal:  General: No swelling.     Cervical back: Normal range of motion and neck supple.  Skin:    General: Skin is warm and dry.     Capillary Refill: Capillary refill takes less than 2 seconds.  Neurological:     General: No focal deficit present.     Mental Status: She is alert.     Cranial Nerves: No cranial nerve deficit.     Sensory: No sensory deficit.     Motor: No weakness.     Coordination: Coordination normal.     Comments: 5+ out of 5 strength throughout, normal sensation, no drift, normal speech, patient able to answer questions without any problems.  Can tell me her name and where she is.  Psychiatric:        Mood and Affect: Mood normal.     ED Results / Procedures / Treatments   Labs (all labs ordered are listed, but only abnormal results are displayed) Labs Reviewed  CBC WITH  DIFFERENTIAL/PLATELET - Abnormal; Notable for the following components:      Result Value   RBC 5.89 (*)    Hemoglobin 15.8 (*)    HCT 50.1 (*)    Monocytes Absolute 1.1 (*)    All other components within normal limits  COMPREHENSIVE METABOLIC PANEL - Abnormal; Notable for the following components:   Glucose, Bld 146 (*)    Total Protein 8.5 (*)    All other components within normal limits  URINALYSIS, ROUTINE W REFLEX MICROSCOPIC - Abnormal; Notable for the following components:   Ketones, ur 20 (*)    All other components within normal limits    EKG EKG Interpretation  Date/Time:  Thursday January 07 2023 11:06:41 EDT Ventricular Rate:  85 PR Interval:  163 QRS Duration: 70 QT Interval:  354 QTC Calculation: 421 R Axis:   31 Text Interpretation: Sinus rhythm Low voltage, precordial leads Borderline T abnormalities, anterior leads Confirmed by Lennice Sites ([id number]) on 01/07/2023 11:46:46 AM  Radiology CT Cervical Spine Wo Contrast  Result Date: 01/07/2023 CLINICAL DATA:  Polytrauma, blunt EXAM: CT CERVICAL SPINE WITHOUT CONTRAST TECHNIQUE: Multidetector CT imaging of the cervical spine was performed without intravenous contrast. Multiplanar CT image reconstructions were also generated. RADIATION DOSE REDUCTION: This exam was performed according to the departmental dose-optimization program which includes automated exposure control, adjustment of the mA and/or kV according to patient size and/or use of iterative reconstruction technique. COMPARISON:  None Available. FINDINGS: Alignment: Facet joints are aligned without dislocation or traumatic listhesis. Dens and lateral masses are aligned. Degenerative grade 1 anterolisthesis of C6 on C7. Trace retrolisthesis at C4-5. Skull base and vertebrae: No acute fracture. No primary bone lesion or focal pathologic process. Soft tissues and spinal canal: No prevertebral fluid or swelling. No visible canal hematoma. Disc levels:  Moderate-advanced  multilevel cervical spondylosis. Upper chest: Negative. Other: Bilateral carotid atherosclerosis. IMPRESSION: 1. No acute fracture or traumatic listhesis of the cervical spine. 2. Moderate-advanced multilevel cervical spondylosis. Electronically Signed   By: Davina Poke D.O.   On: 01/07/2023 14:04   CT Head Wo Contrast  Result Date: 01/07/2023 CLINICAL DATA:  Head trauma, moderate-severe EXAM: CT HEAD WITHOUT CONTRAST TECHNIQUE: Contiguous axial images were obtained from the base of the skull through the vertex without intravenous contrast. RADIATION DOSE REDUCTION: This exam was performed according to the departmental dose-optimization program which includes automated exposure control, adjustment of the mA and/or kV according to patient size and/or use of iterative reconstruction technique. COMPARISON:  None  Available. FINDINGS: Brain: There is periventricular white matter decreased attenuation consistent with small vessel ischemic changes. Ventricles, sulci and cisterns are prominent consistent with age related involutional changes. No acute intracranial hemorrhage, mass effect or shift. No hydrocephalus. Vascular: No hyperdense vessel or unexpected calcification. Skull: Normal. Negative for fracture or focal lesion. Sinuses/Orbits: No acute finding. IMPRESSION: Atrophy and chronic small vessel ischemic changes. No acute intracranial process identified. Electronically Signed   By: Sammie Bench M.D.   On: 01/07/2023 13:58   DG Pelvis 1-2 Views  Result Date: 01/07/2023 CLINICAL DATA:  Fall. EXAM: PELVIS - 1 VIEW COMPARISON:  11/24/2016. FINDINGS: There is no evidence of pelvic fracture or diastasis. No pelvic bone lesions are seen. Severe degenerative joint disease noted right hip with sclerosis, joint space narrowing and osteophytes. Lumbosacral degenerative changes. IMPRESSION: Degenerative changes.  No acute osseous abnormalities. Electronically Signed   By: Sammie Bench M.D.   On: 01/07/2023  12:06   DG Chest 2 View  Result Date: 01/07/2023 CLINICAL DATA:  fall EXAM: CHEST - 2 VIEW COMPARISON:  None available. FINDINGS: The heart size and mediastinal contours are within normal limits. Both lungs are clear. No pneumothorax or pleural effusion. There are thoracic degenerative changes. Aorta is calcified. IMPRESSION: No active cardiopulmonary disease. Electronically Signed   By: Sammie Bench M.D.   On: 01/07/2023 12:03    Procedures Procedures    Medications Ordered in ED Medications - No data to display  ED Course/ Medical Decision Making/ A&P                             Medical Decision Making Amount and/or Complexity of Data Reviewed Labs: ordered. Radiology: ordered.   [name] is here after unwitnessed fall at home.  Normal vitals.  No fever.  History of dementia, diabetes, high blood pressure.  Not on blood thinners.  She has caregiver that is with her daily.  She was seen last night by the caregiver and this morning she was found on the floor next to a knocked over chair.  She has no pain.  However we will be conservative and check basic labs and urinalysis and get a chest x-ray and head and neck CT and pelvis x-ray to evaluate for any traumatic injuries.  She is well-appearing.  She seems to be at her baseline.  She can tell me her name and where she is and overall she appears well.  Per my review interpretation of labs no significant anemia or electrolyte abnormality kidney injury.  Urinalysis negative for infection.  Head and neck CT per radiology reports unremarkable.  Chest x-ray per my review interpretation with no evidence of pneumonia or pneumothorax.  Patient discharged in good condition.  This chart was dictated using voice recognition software.  Despite best efforts to proofread,  errors can occur which can change the documentation meaning.         Final Clinical Impression(s) / ED Diagnoses Final diagnoses:  Fall, initial encounter    Rx /  DC Orders ED Discharge Orders     None         Lennice Sites, DO 01/07/23 1411

## 2023-01-08 ENCOUNTER — Telehealth: Payer: Self-pay | Admitting: Family Medicine

## 2023-01-08 NOTE — Telephone Encounter (Signed)
Spoke with the patient and informed her of the message below as I do not see a number listed for her brother.  Patient's caregiver, Hermelinda Dellen picked up the phone and was informed of the information below and an appt was scheduled for 4/2 with Dr Martinique.  Glenise was also advised to have the patient sign a release form when she comes in to allow [place] to speak with her.  She requested I call the patient's brother-Jerry Meland at [phone] and inform him of the message below.  Information was relayed to the patient's brother and he was advised to have the patient sign a release stating we can talk with him as I do not see this in the FYIs.

## 2023-01-08 NOTE — Telephone Encounter (Signed)
BP readings....3-18  190/106.   3-19  started amLODipine (NORVASC) 10 MG tablet at 10mg  193/98.   3-20  181/98am  161/81pm. says patient was disoriented, went outside with no clothes on, talking out of her head.   3-21 patient was on the floor when caregiver came to work, called 911 went to [place]  bp 170/90. Patient's brother asking if the blood pressure medication is causing this confusion, which is listed as a side effect. Says patient was unsure of whose home she was in, disoriented. Says patient did not eat anything yesterday, still has not eaten and they have not administered meds since she has nothing on her stomach.

## 2023-01-18 NOTE — Progress Notes (Unsigned)
Chief Complaint  Patient presents with   Follow-up   HPI: [name] is a 87 y.o. female with past medical history significant for hypertension, DM 2, dementia, OSA, and hyperlipidemia here today to follow on recent ED visit.  Evaluated in the ED on 01/07/2023 after a fall and odorous urine. The incident of falling was not witnessed, her caregiver found her on the floor, she had fallen the night before and she was unable to rise without assistance.When she was found she had mild confusion, she is back to her baseline.  She resides independently but receives regular check-ins from a caregiver,who stays with her for 4 hours/day from Monday through Friday and weekend  her cousin takes over.  She uses a cane for transfer.  Lab Results  Component Value Date   WBC 8.7 01/07/2023   HGB 15.8 (H) 01/07/2023   HCT 50.1 (H) 01/07/2023   MCV 85.1 01/07/2023   PLT 372 01/07/2023   Lab Results  Component Value Date   CREATININE 0.80 01/07/2023   BUN 20 01/07/2023   NA 137 01/07/2023   K 3.5 01/07/2023   CL 98 01/07/2023   CO2 26 01/07/2023   Lab Results  Component Value Date   ALT 18 01/07/2023   AST 29 01/07/2023   ALKPHOS 86 01/07/2023   BILITOT 1.1 01/07/2023   UA was negative  except for presence of ketones, 20. She reports an overall feeling of wellness with no unusual pains.  DM II: She is not checking her BS's.  HTN:BP was elevated at 151/64 in the ED and was elevated 150-160's/70's. Her Amlodipine was increased from 5 mg to 10 mg during ED visit. She is also on HCTZ 25 mg daily.  Her blood pressure readings have shown gradual improvement, currently at 130's/70's, and she has not experienced any  Negative for visual changes, chest pain, dyspnea, palpitation,focal weakness, or edema.  Her caregiver has observed a gradual decrease in Audris's appetite for the past few months. Negative for abdominal pain,changes in bowel habits,or urinary symptoms.  Review of Systems   Constitutional:  Negative for chills and fever.  HENT:  Negative for congestion and sore throat.   Respiratory:  Negative for cough and wheezing.   Gastrointestinal:  Negative for nausea and vomiting.  Genitourinary:  Negative for decreased urine volume, dysuria and hematuria.  Neurological:  Negative for syncope and facial asymmetry.  Psychiatric/Behavioral:  Negative for hallucinations. The patient is not nervous/anxious.   See other pertinent positives and negatives in HPI.  Current Outpatient Medications on File Prior to Visit  Medication Sig Dispense Refill   ACCU-CHEK AVIVA PLUS test strip USE AS INSTRUCTED 100 each 1   ACCU-CHEK SOFTCLIX LANCETS lancets Pt checks blood sugars twice per day. DX: E11.9 100 each 3   amLODipine (NORVASC) 10 MG tablet Take 1 tablet (10 mg total) by mouth once daily. 90 tablet 3   donepezil (ARICEPT) 10 MG tablet TAKE 1 TABLET(10 MG) BY MOUTH AT BEDTIME 90 tablet 1   hydrochlorothiazide (HYDRODIURIL) 25 MG tablet Take 0.5 tablets (12.5 mg total) by mouth daily. 45 tablet 3   Ketotifen Fumarate (ZADITOR OP) Apply 0.035 % to eye 2 (two) times daily.      metFORMIN (GLUCOPHAGE) 500 MG tablet TAKE 2 TABLETS BY MOUTH TWICE DAILY WITH BREAKFAST AND SUPPER 360 tablet 3   multivitamin-iron-minerals-folic acid (CENTRUM) chewable tablet Chew 1 tablet by mouth daily.     simvastatin (ZOCOR) 40 MG tablet Take 1 tablet (40 mg  total) by mouth daily. 90 tablet 3   No current facility-administered medications on file prior to visit.   Past Medical History:  Diagnosis Date   DEGENERATIVE JOINT DISEASE 08/25/2010   HYPERLIPIDEMIA 03/08/2009   HYPERTENSION 03/08/2009   Obesity    PREDIABETES 08/23/2009   Renal calculi    No Known Allergies  Social History   Socioeconomic History   Marital status: Single    Spouse name: Not on file   Number of children: Not on file   Years of education: Not on file   Highest education level: Not on file  Occupational History    Not on file  Tobacco Use   Smoking status: Never   Smokeless tobacco: Never  Vaping Use   Vaping Use: Never used  Substance and Sexual Activity   Alcohol use: No   Drug use: No   Sexual activity: Not on file  Other Topics Concern   Not on file  Social History Narrative   Not on file   Social Determinants of Health   Financial Resource Strain: Low Risk  (06/04/2022)   Overall Financial Resource Strain (CARDIA)    Difficulty of Paying Living Expenses: Not hard at all  Food Insecurity: No Food Insecurity (06/04/2022)   Hunger Vital Sign    Worried About Running Out of Food in the Last Year: Never true    Ran Out of Food in the Last Year: Never true  Transportation Needs: No Transportation Needs (06/04/2022)   PRAPARE - Hydrologist (Medical): No    Lack of Transportation (Non-Medical): No  Physical Activity: Inactive (06/04/2022)   Exercise Vital Sign    Days of Exercise per Week: 0 days    Minutes of Exercise per Session: 0 min  Stress: No Stress Concern Present (06/04/2022)   [place]    Feeling of Stress : Not at all  Social Connections: Moderately Integrated (06/04/2022)   Social Connection and Isolation Panel [NHANES]    Frequency of Communication with Friends and Family: More than three times a week    Frequency of Social Gatherings with Friends and Family: More than three times a week    Attends Religious Services: More than 4 times per year    Active Member of Clubs or Organizations: Yes    Attends Archivist Meetings: More than 4 times per year    Marital Status: Never married   Vitals:   01/19/23 1138  BP: 138/72  Pulse: 96  Resp: 16  SpO2: 95%   Body mass index is 30 kg/m.  Physical Exam Vitals and nursing note reviewed.  Constitutional:      General: She is not in acute distress.    Appearance: She is well-developed.  HENT:     Head: Normocephalic  and atraumatic.     Mouth/Throat:     Mouth: Mucous membranes are moist.     Pharynx: Oropharynx is clear.  Eyes:     Conjunctiva/sclera: Conjunctivae normal.  Cardiovascular:     Rate and Rhythm: Normal rate and regular rhythm.     Pulses:          Dorsalis pedis pulses are 2+ on the right side and 2+ on the left side.     Heart sounds: Murmur (SEM I-II LUSB and RUSB) heard.  Pulmonary:     Effort: Pulmonary effort is normal. No respiratory distress.     Breath sounds: Normal breath sounds.  Abdominal:     Palpations: Abdomen is soft.     Tenderness: There is no abdominal tenderness.  Musculoskeletal:     Right lower leg: No edema.     Left lower leg: No edema.  Lymphadenopathy:     Cervical: No cervical adenopathy.  Skin:    General: Skin is warm.     Findings: No erythema or rash.  Neurological:     General: No focal deficit present.     Mental Status: She is alert. Mental status is at baseline.     Cranial Nerves: No cranial nerve deficit.     Gait: Gait normal.     Comments: Does not remember date but she remembers recent news about the [place] breach collapsing. Oriented in place and person. Antalgic, mildly unstable gait assisted with a cane.  Psychiatric:        Mood and Affect: Mood and affect normal.   ASSESSMENT AND PLAN:  Ms.Nolie was seen today for follow-up.  Diagnoses and all orders for this visit:  Fall, subsequent encounter Not clear cause. Labs done in the ED are suggestive of mild dehydration, stressed the importance of adequate hydration. We discussed fall precautions. Some of her comorbidities as well as medication could increase her risk for falls. We also discussed possible insurance coverage for a life alert device and the importance of modifying her living environment to minimize fall hazards.  Essential hypertension Re-checked: 140/60. Continue amlodipine 10 mg daily and HCTZ 25 mg daily. BP goal given her age < 150/90. Continue low-salt  diet. Continue monitoring BP regularly. Keep next follow-up appointment with PCP.  Return if symptoms worsen or fail to improve, for keep next appointment.  Maleta Pacha G. Martinique, MD  [facility]. [place] office.

## 2023-01-19 ENCOUNTER — Ambulatory Visit (INDEPENDENT_AMBULATORY_CARE_PROVIDER_SITE_OTHER): Payer: Medicare PPO | Admitting: Family Medicine

## 2023-01-19 ENCOUNTER — Encounter: Payer: Self-pay | Admitting: Family Medicine

## 2023-01-19 VITALS — BP 138/72 | HR 96 | Resp 16 | Ht 64.0 in

## 2023-01-19 DIAGNOSIS — I10 Essential (primary) hypertension: Secondary | ICD-10-CM | POA: Diagnosis not present

## 2023-01-19 DIAGNOSIS — Z9181 History of falling: Secondary | ICD-10-CM

## 2023-01-19 DIAGNOSIS — W19XXXD Unspecified fall, subsequent encounter: Secondary | ICD-10-CM

## 2023-01-19 NOTE — Patient Instructions (Addendum)
A few things to remember from today's visit:  Fall, subsequent encounter  Essential hypertension  No changes today. Blood pressure goal under 150/90. Continue monitoring blood pressure. A life alarm device will be very useful, please check with your insurance about coverage.  Do not use My Chart to request refills or for acute issues that need immediate attention. If you send a my chart message, it may take a few days to be addressed, specially if I am not in the office.  Please be sure medication list is accurate. If a new problem present, please set up appointment sooner than planned today.

## 2023-02-08 ENCOUNTER — Other Ambulatory Visit: Payer: Self-pay | Admitting: Family Medicine

## 2023-04-27 ENCOUNTER — Other Ambulatory Visit: Payer: Self-pay | Admitting: Family Medicine

## 2023-05-31 ENCOUNTER — Telehealth: Payer: Self-pay | Admitting: Family Medicine

## 2023-05-31 NOTE — Telephone Encounter (Signed)
error 

## 2023-06-01 ENCOUNTER — Ambulatory Visit: Payer: Medicare PPO | Admitting: Family Medicine

## 2023-06-01 VITALS — BP 142/70 | HR 95 | Temp 98.0°F | Ht 64.0 in | Wt 177.4 lb

## 2023-06-01 DIAGNOSIS — E119 Type 2 diabetes mellitus without complications: Secondary | ICD-10-CM | POA: Diagnosis not present

## 2023-06-01 DIAGNOSIS — K921 Melena: Secondary | ICD-10-CM

## 2023-06-01 DIAGNOSIS — Z7984 Long term (current) use of oral hypoglycemic drugs: Secondary | ICD-10-CM

## 2023-06-01 LAB — CBC WITH DIFFERENTIAL/PLATELET
Basophils Absolute: 0.1 10*3/uL (ref 0.0–0.1)
Basophils Relative: 1.3 % (ref 0.0–3.0)
Eosinophils Absolute: 0.3 10*3/uL (ref 0.0–0.7)
Eosinophils Relative: 4.1 % (ref 0.0–5.0)
HCT: 44.4 % (ref 36.0–46.0)
Hemoglobin: 14.3 g/dL (ref 12.0–15.0)
Lymphocytes Relative: 36.3 % (ref 12.0–46.0)
Lymphs Abs: 2.3 10*3/uL (ref 0.7–4.0)
MCHC: 32.1 g/dL (ref 30.0–36.0)
MCV: 85.6 fl (ref 78.0–100.0)
Monocytes Absolute: 0.8 10*3/uL (ref 0.1–1.0)
Monocytes Relative: 13.3 % — ABNORMAL HIGH (ref 3.0–12.0)
Neutro Abs: 2.8 10*3/uL (ref 1.4–7.7)
Neutrophils Relative %: 45 % (ref 43.0–77.0)
Platelets: 425 10*3/uL — ABNORMAL HIGH (ref 150.0–400.0)
RBC: 5.19 Mil/uL — ABNORMAL HIGH (ref 3.87–5.11)
RDW: 14.3 % (ref 11.5–15.5)
WBC: 6.3 10*3/uL (ref 4.0–10.5)

## 2023-06-01 LAB — HEMOGLOBIN A1C: Hgb A1c MFr Bld: 7.4 % — ABNORMAL HIGH (ref 4.6–6.5)

## 2023-06-01 NOTE — Progress Notes (Signed)
Established Patient Office Visit  Subjective   Patient ID: [name], female    DOB: [birth year]  Age: 87 y.o. MRN: [id number]  Chief Complaint  Patient presents with   Rectal Bleeding   Emesis    HPI   Ms. Huitt has history of hypertension, diverticulosis of colon, type 2 diabetes, dementia, history of kidney stones, hyperlipidemia.  Limited history from patient secondary to her dementia.  She is here with caregiver today who relates that over the weekend they noted a few spots of blood which looked more bright blood on her bed sheets.  She felt like she had some rectal irritation.  They have not noted any blood in the toilet or any obvious blood in her underwear.  She is eating well.  Did have diarrhea after 1 bowel movement yesterday but no persistent diarrhea.  They also noted what look like emesis on the carpet once but no observed emesis episodes.  She has not complained of any abdominal pain.  No known history of vaginal bleeding.  Last colonoscopy was several years ago.  No anticoagulant or aspirin use.  Her brother Dorene Sorrow is power of attorney.  Ms. Fuente has type 2 diabetes which has been treated with metformin.  Recent A1c 7.7%.  They are not monitoring blood sugars regularly.  Past Medical History:  Diagnosis Date   DEGENERATIVE JOINT DISEASE 08/25/2010   HYPERLIPIDEMIA 03/08/2009   HYPERTENSION 03/08/2009   Obesity    PREDIABETES 08/23/2009   Renal calculi    Past Surgical History:  Procedure Laterality Date   TONSILLECTOMY      reports that she has never smoked. She has never used smokeless tobacco. She reports that she does not drink alcohol and does not use drugs. family history includes Diabetes in her father. No Known Allergies  ROS Limited history obtainable from patient secondary to advanced dementia.  History obtained from her caregiver as above   Objective:     BP (!) 142/70 (BP Location: Left Arm, Patient Position: Sitting, Cuff Size: Normal)    Pulse 95   Temp 98 F (36.7 C) (Oral)   Ht 5\' 4"  (1.626 m)   Wt 177 lb 6.4 oz (80.5 kg)   SpO2 95%   BMI 30.45 kg/m  BP Readings from Last 3 Encounters:  06/01/23 (!) 142/70  01/19/23 138/72  01/07/23 (!) 150/60   Wt Readings from Last 3 Encounters:  06/01/23 177 lb 6.4 oz (80.5 kg)  12/30/22 174 lb 12.8 oz (79.3 kg)  04/02/21 180 lb 12.8 oz (82 kg)      Physical Exam Vitals reviewed.  Cardiovascular:     Rate and Rhythm: Normal rate and regular rhythm.  Abdominal:     General: There is no distension.     Palpations: Abdomen is soft.     Tenderness: There is no abdominal tenderness. There is no guarding or rebound.  Genitourinary:    Comments: Rectal exam reveals some fecal incontinence with some soft stool around the anal region.  No visible external hemorrhoids.  Digital exam reveals no palpated masses.  She has some semisolid brownish stool which was faintly heme positive.  No gross blood. Neurological:     Mental Status: She is alert.      No results found for any visits on 06/01/23.  Last CBC Lab Results  Component Value Date   WBC 8.7 01/07/2023   HGB 15.8 (H) 01/07/2023   HCT 50.1 (H) 01/07/2023   MCV 85.1 01/07/2023  MCH 26.8 01/07/2023   RDW 14.8 01/07/2023   PLT 372 01/07/2023   Last metabolic panel Lab Results  Component Value Date   GLUCOSE 146 (H) 01/07/2023   NA 137 01/07/2023   K 3.5 01/07/2023   CL 98 01/07/2023   CO2 26 01/07/2023   BUN 20 01/07/2023   CREATININE 0.80 01/07/2023   GFRNONAA >60 01/07/2023   CALCIUM 10.0 01/07/2023   PROT 8.5 (H) 01/07/2023   ALBUMIN 4.5 01/07/2023   BILITOT 1.1 01/07/2023   ALKPHOS 86 01/07/2023   AST 29 01/07/2023   ALT 18 01/07/2023   ANIONGAP 13 01/07/2023   Last lipids Lab Results  Component Value Date   CHOL 240 (H) 12/30/2022   HDL 53.70 12/30/2022   LDLCALC 157 (H) 12/30/2022   LDLDIRECT 126.0 09/01/2012   TRIG 147.0 12/30/2022   CHOLHDL 4 12/30/2022   Last hemoglobin A1c Lab  Results  Component Value Date   HGBA1C 7.7 (H) 12/30/2022   Last thyroid functions Lab Results  Component Value Date   TSH 4.40 06/04/2020      The ASCVD Risk score (Arnett DK, et al., 2019) failed to calculate for the following reasons:   The 2019 ASCVD risk score is only valid for ages 108 to 71    Assessment & Plan:   #1 probable hematochezia based on history.  She had recent hemoglobin few months ago which was over 15.  One-time episode of blood noted on bed sheets which they think is from her rectum but no observed hematochezia.  She does have faintly heme positive stool today on exam.  No rectal masses palpated.  No recent vaginal bleeding reported.  -Check CBC -We did discuss pros and cons of further evaluation.  Given her age and multiple comorbidities and advanced dementia may not be worth going through colonoscopy or GI evaluation unless she has heavy bleeding.  Check CBC first  -Watch closely to try identify if she is having any visible blood in her bowel movements versus vagina  #2 type 2 diabetes.  History of fair control.  Recheck A1c today.   No follow-ups on file.    Evelena Peat, MD

## 2023-06-01 NOTE — Patient Instructions (Signed)
Watch closely for any recurrent bleeding from either the rectum or vagina.  We will call with lab results.

## 2023-06-08 ENCOUNTER — Telehealth (INDEPENDENT_AMBULATORY_CARE_PROVIDER_SITE_OTHER): Payer: Medicare PPO | Admitting: Family Medicine

## 2023-06-08 VITALS — Ht 64.0 in | Wt 177.0 lb

## 2023-06-08 DIAGNOSIS — Z0000 Encounter for general adult medical examination without abnormal findings: Secondary | ICD-10-CM | POA: Diagnosis not present

## 2023-06-08 NOTE — Patient Instructions (Signed)
I really enjoyed getting to talk with you today! I am available on Tuesdays and Thursdays for virtual visits if you have any questions or concerns, or if I can be of any further assistance.   CHECKLIST FROM ANNUAL WELLNESS VISIT:  -Follow up (please call to schedule if not scheduled after visit):   -yearly for annual wellness visit with primary care office  Here is a list of your preventive care/health maintenance measures and the plan for each if any are due:  PLAN For any measures below that may be due:   Health Maintenance  Topic Date Due   DTaP/Tdap/Td (1 - Tdap) Never done   Zoster Vaccines- Shingrix (1 of 2) Never done   DEXA SCAN  Never done   FOOT EXAM  06/25/2018   OPHTHALMOLOGY EXAM  10/15/2020   Diabetic kidney evaluation - Urine ACR  02/01/2021   COVID-19 Vaccine (4 - 2023-24 season) 06/19/2022   INFLUENZA VACCINE  05/20/2023   HEMOGLOBIN A1C  12/02/2023   Diabetic kidney evaluation - eGFR measurement  01/07/2024   Medicare Annual Wellness (AWV)  06/07/2024   Pneumonia Vaccine 33+ Years old  Completed   HPV VACCINES  Aged Out    -See a dentist at least yearly  -Get your eyes checked and then per your eye specialist's recommendations  -Other issues addressed today:   -I have included below further information regarding a healthy whole foods based diet, physical activity guidelines for adults, stress management and opportunities for social connections. I hope you find this information useful.   -----------------------------------------------------------------------------------------------------------------------------------------------------------------------------------------------------------------------------------------------------------  NUTRITION: -eat real food: lots of colorful vegetables (half the plate) and fruits -5-7 servings of vegetables and fruits per day (fresh or steamed is best), exp. 2 servings of vegetables with lunch and dinner and 2 servings  of fruit per day. Berries and greens such as kale and collards are great choices.  -consume on a regular basis: whole grains (make sure first ingredient on label contains the word "whole"), fresh fruits, fish, nuts, seeds, healthy oils (such as olive oil, avocado oil, grape seed oil) -may eat small amounts of dairy and lean meat on occasion, but avoid processed meats such as ham, bacon, lunch meat, etc. -drink water -try to avoid fast food and pre-packaged foods, processed meat -most experts advise limiting sodium to < 2300mg  per day, should limit further is any chronic conditions such as high blood pressure, heart disease, diabetes, etc. The American Heart Association advised that < 1500mg  is is ideal -try to avoid foods that contain any ingredients with names you do not recognize  -try to avoid sugar/sweets (except for the natural sugar that occurs in fresh fruit) -try to avoid sweet drinks -try to avoid white rice, white bread, pasta (unless whole grain), white or yellow potatoes  EXERCISE GUIDELINES FOR ADULTS: -if you wish to increase your physical activity, do so gradually and with the approval of your doctor -STOP and seek medical care immediately if you have any chest pain, chest discomfort or trouble breathing when starting or increasing exercise  -move and stretch your body, legs, feet and arms when sitting for long periods -Physical activity guidelines for optimal health in adults: -least 150 minutes per week of aerobic exercise (can talk, but not sing) once approved by your doctor, 20-30 minutes of sustained activity or two 10 minute episodes of sustained activity every day.  -resistance training at least 2 days per week if approved by your doctor -balance exercises 3+ days per week:  Stand somewhere where you have something sturdy to hold onto if you lose balance.    1) lift up on toes, start with 5x per day and work up to 20x   2) stand and lift on leg straight out to the side so  that foot is a few inches of the floor, start with 5x each side and work up to 20x each side   3) stand on one foot, start with 5 seconds each side and work up to 20 seconds on each side  If you need ideas or help with getting more active:  -Silver sneakers https://tools.silversneakers.com  -Walk with a Doc: http://www.duncan-williams.com/  -try to include resistance (weight lifting/strength building) and balance exercises twice per week: or the following link for ideas: http://castillo-powell.com/  BuyDucts.dk  STRESS MANAGEMENT: -can try meditating, or just sitting quietly with deep breathing while intentionally relaxing all parts of your body for 5 minutes daily -if you need further help with stress, anxiety or depression please follow up with your primary doctor or contact the wonderful folks at [facility]: [phone]  SOCIAL CONNECTIONS: -options in [place] if you wish to engage in more social and exercise related activities:  -Silver sneakers https://tools.silversneakers.com  -Walk with a Doc: http://www.duncan-williams.com/  -Check out the [facility] Active Adults 50+ section on the [place] of [employer] (hiking clubs, book clubs, cards and games, chess, exercise classes, aquatic classes and much more) - see the website for details: https://www.[place].gov/departments/parks-recreation/active-adults50  -YouTube has lots of exercise videos for different ages and abilities as well  -[facility] (a variety of indoor and outdoor inperson activities for adults). 4584284617. [address]  -Virtual Online Classes (a variety of topics): see seniorplanet.org or call [phone]  -consider volunteering at a school, hospice center, church, senior center or elsewhere

## 2023-06-08 NOTE — Progress Notes (Signed)
PATIENT CHECK-IN and HEALTH RISK ASSESSMENT QUESTIONNAIRE:  -completed by phone/video for upcoming Medicare Preventive Visit  Pre-Visit Check-in: 1)Vitals (height, wt, BP, etc) - record in vitals section for visit on day of visit Request home vitals (wt, BP, etc.) and enter into vitals, THEN update Vital Signs SmartPhrase below at the top of the HPI. See below.  2)Review and Update Medications, Allergies PMH, Surgeries, Social history in Epic 3)Hospitalizations in the last year with date/reason?  No   4)Review and Update Care Team (patient's specialists) in Epic 5) Complete PHQ9 in Epic  6) Complete Fall Screening in Epic 7)Review all Health Maintenance Due and order under PCP if not done.  8)Medicare Wellness Questionnaire: Answer theses question about your habits: Do you drink alcohol? No  If yes, how many drinks do you have a day? Have you ever smoked? No   Do you use smokeless tobacco?no  Do you use an illicit drugs? No  Do you exercises?  Yes IF so, what type and how many days/minutes per week?daily, walks outside every day 10-15 times, reports she does not like to sit, she likes to keep busy Typical breakfast: cereal Typical lunch sandwich, chicken tenders Typical dinner same as lunch, every day with dinner eats veggies Typical snacks: fruits, cheese, nuts crackers   Beverages: water , sometimes a gatorade   Answer theses question about you: Can you perform most household chores?yes Do you find it hard to follow a conversation in a noisy room?  No  Do you often ask people to speak up or repeat themselves?no  Do you feel that you have a problem with memory? No  Do you balance your checkbook and or bank acounts? No  Do you feel safe at home?yes  Last dentist visit?not sure  Do you need assistance with any of the following: Please note if so  - none except as noted  Driving? Does not drive - others drive  Feeding yourself?  Getting from bed to chair?  Getting to the  toilet?  Bathing or showering?  Dressing yourself?  Managing money?  Climbing a flight of stairs?   Preparing meals?  Do you have Advanced Directives in place (Living Will, Healthcare Power or Attorney)?  Yes    Last eye Exam and location? Last year, reports sees once per year   Do you currently use prescribed or non-prescribed narcotic or opioid pain medications?no   Do you have a history or close family history of breast, ovarian, tubal or peritoneal cancer or a family member with BRCA (breast cancer susceptibility 1 and 2) gene mutations? No   Got wt and ht from most recent visit, aid/assistant had to leave- unable to get BP. Request home vitals (wt, BP, etc.) and enter into vitals, THEN update Vital Signs SmartPhrase below at the top of the HPI. See below.   Nurse/Assistant Credentials/time stamp: Tora Perches -CMA 4:06 pm    ----------------------------------------------------------------------------------------------------------------------------------------------------------------------------------------------------------------------  Vital Signs: Vital signs are patient reported.   MEDICARE ANNUAL PREVENTIVE VISIT WITH PROVIDER: (Welcome to Medicare, initial annual wellness or annual wellness exam)  Virtual Visit via Phone Note  I connected with KINESHA DROZD on 06/08/23 by phone and verified that I am speaking with the correct person using two identifiers.  Location patient: home Location provider:work or home office Persons participating in the virtual visit: patient, provider  Concerns and/or follow up today: none.   See HM section in Epic for other details of completed HM.    ROS: negative for report  of fevers, unintentional weight loss, vision changes, vision loss, hearing loss or change, chest pain, sob, hemoptysis, melena, hematochezia, hematuria, falls, bleeding or bruising, thoughts of suicide or self harm, memory loss  Patient-completed extensive health risk  assessment - reviewed and discussed with the patient: See Health Risk Assessment completed with patient prior to the visit either above or in recent phone note. This was reviewed in detailed with the patient today and appropriate recommendations, orders and referrals were placed as needed per Summary below and patient instructions.   Review of Medical History: -PMH, PSH, Family History and current specialty and care providers reviewed and updated and listed below   Patient Care Team: Kristian Covey, MD as PCP - General   Past Medical History:  Diagnosis Date   DEGENERATIVE JOINT DISEASE 08/25/2010   HYPERLIPIDEMIA 03/08/2009   HYPERTENSION 03/08/2009   Obesity    PREDIABETES 08/23/2009   Renal calculi     Past Surgical History:  Procedure Laterality Date   TONSILLECTOMY      Social History   Socioeconomic History   Marital status: Single    Spouse name: Not on file   Number of children: Not on file   Years of education: Not on file   Highest education level: Not on file  Occupational History   Not on file  Tobacco Use   Smoking status: Never   Smokeless tobacco: Never  Vaping Use   Vaping status: Never Used  Substance and Sexual Activity   Alcohol use: No   Drug use: No   Sexual activity: Not on file  Other Topics Concern   Not on file  Social History Narrative   Not on file   Social Determinants of Health   Financial Resource Strain: Low Risk  (06/04/2022)   Overall Financial Resource Strain (CARDIA)    Difficulty of Paying Living Expenses: Not hard at all  Food Insecurity: No Food Insecurity (06/04/2022)   Hunger Vital Sign    Worried About Running Out of Food in the Last Year: Never true    Ran Out of Food in the Last Year: Never true  Transportation Needs: No Transportation Needs (06/04/2022)   PRAPARE - Administrator, Civil Service (Medical): No    Lack of Transportation (Non-Medical): No  Physical Activity: Inactive (06/04/2022)   Exercise  Vital Sign    Days of Exercise per Week: 0 days    Minutes of Exercise per Session: 0 min  Stress: No Stress Concern Present (06/04/2022)   [employer] of Occupational Health - Occupational Stress Questionnaire    Feeling of Stress : Not at all  Social Connections: Moderately Integrated (06/04/2022)   Social Connection and Isolation Panel [NHANES]    Frequency of Communication with Friends and Family: More than three times a week    Frequency of Social Gatherings with Friends and Family: More than three times a week    Attends Religious Services: More than 4 times per year    Active Member of [employer] or Organizations: Yes    Attends Banker Meetings: More than 4 times per year    Marital Status: Never married  Intimate Partner Violence: Not At Risk (06/04/2022)   Humiliation, Afraid, Rape, and Kick questionnaire    Fear of Current or Ex-Partner: No    Emotionally Abused: No    Physically Abused: No    Sexually Abused: No    Family History  Problem Relation Age of Onset   Diabetes Father  Current Outpatient Medications on File Prior to Visit  Medication Sig Dispense Refill   ACCU-CHEK AVIVA PLUS test strip USE AS INSTRUCTED 100 each 1   ACCU-CHEK SOFTCLIX LANCETS lancets Pt checks blood sugars twice per day. DX: E11.9 100 each 3   amLODipine (NORVASC) 10 MG tablet Take 1 tablet (10 mg total) by mouth once daily. 90 tablet 3   donepezil (ARICEPT) 10 MG tablet TAKE 1 TABLET(10 MG) BY MOUTH AT BEDTIME 90 tablet 1   hydrochlorothiazide (HYDRODIURIL) 25 MG tablet TAKE 1/2 TABLET BY MOUTH DAILY 90 tablet 0   Ketotifen Fumarate (ZADITOR OP) Apply 0.035 % to eye 2 (two) times daily.      metFORMIN (GLUCOPHAGE) 500 MG tablet TAKE 2 TABLETS BY MOUTH TWICE DAILY WITH BREAKFAST AND SUPPER 360 tablet 3   multivitamin-iron-minerals-folic acid (CENTRUM) chewable tablet Chew 1 tablet by mouth daily.     simvastatin (ZOCOR) 40 MG tablet Take 1 tablet (40 mg total) by mouth daily.  90 tablet 3   No current facility-administered medications on file prior to visit.    No Known Allergies     Physical Exam Vitals requested from patient and listed below if patient had equipment and was able to obtain at home for this virtual visit: There were no vitals filed for this visit. Estimated body mass index is 30.38 kg/m as calculated from the following:   Height as of this encounter: 5\' 4"  (1.626 m).   Weight as of this encounter: 177 lb (80.3 kg).  EKG (optional): deferred due to virtual visit  GENERAL: alert, oriented, no acute distress detected, full vision exam deferred due to pandemic and/or virtual encounter  PSYCH/NEURO: pleasant and cooperative, no obvious depression or anxiety, speech and thought processing grossly intact, Cognitive function grossly intact        06/01/2023   11:14 AM 06/04/2022    1:14 PM 05/13/2021    1:11 PM 02/02/2020    1:39 PM 07/12/2018   11:21 AM  Depression screen PHQ 2/9  Decreased Interest 0 0 0 0 0  Down, Depressed, Hopeless 0 0 0 0 0  PHQ - 2 Score 0 0 0 0 0       06/25/2017   12:46 PM 07/12/2018   11:21 AM 05/13/2021    1:13 PM 06/04/2022    1:17 PM 06/01/2023   11:14 AM  Fall Risk  Falls in the past year? No No 0 0 1  Was there an injury with Fall?   0 0 0  Fall Risk Category Calculator   0 0 1  Fall Risk Category (Retired)   Low Low   (RETIRED) Patient Fall Risk Level    Low fall risk   Patient at Risk for Falls Due to   Impaired vision No Fall Risks No Fall Risks  Fall risk Follow up   Falls prevention discussed  Falls evaluation completed     SUMMARY AND PLAN:  Encounter for Medicare annual wellness exam   Discussed applicable health maintenance/preventive health measures and advised and referred or ordered per patient preferences: -discussed vaccines due and recommendations -she reports she has had to dtap in the last few years and is not due -advised can get the other vaccines at the pharmacy -discussed  other measures due and she agrees to do eye exam, reports dies yearly Health Maintenance  Topic Date Due   DTaP/Tdap/Td (1 - Tdap) Never done   Zoster Vaccines- Shingrix (1 of 2) Never done   DEXA SCAN  Never done   FOOT EXAM  06/25/2018   OPHTHALMOLOGY EXAM  10/15/2020   Diabetic kidney evaluation - Urine ACR  02/01/2021   COVID-19 Vaccine (4 - 2023-24 season) 06/19/2022   INFLUENZA VACCINE  05/20/2023   HEMOGLOBIN A1C  12/02/2023   Diabetic kidney evaluation - eGFR measurement  01/07/2024   Medicare Annual Wellness (AWV)  06/07/2024   Pneumonia Vaccine 65+ Years old  Completed   HPV VACCINES  Aged Out  Education and counseling on the following was provided based on the above review of health and a plan/checklist for the patient, along with additional information discussed, was provided for the patient in the patient instructions :   -Provided safe balance exercises that can be done at home to improve balance and discussed exercise guidelines for adults with include balance exercises at least 3 days per week. See patient instructions.  -Advised and counseled on a healthy lifestyle - including the importance of a healthy diet, regular physical activity, social connections and stress management. -Reviewed patient's current diet. Advised and counseled on a whole foods based healthy diet. A summary of a healthy diet was provided in the Patient Instructions.  -reviewed patient's current physical activity level and discussed exercise guidelines for adults. Discussed community resources and ideas for safe exercise at home to assist in meeting exercise guideline recommendations in a safe and healthy way.  -Advise yearly dental visits at minimum and regular eye exams   Follow up: see patient instructions     Patient Instructions  I really enjoyed getting to talk with you today! I am available on Tuesdays and Thursdays for virtual visits if you have any questions or concerns, or if I can be of  any further assistance.   CHECKLIST FROM ANNUAL WELLNESS VISIT:  -Follow up (please call to schedule if not scheduled after visit):   -yearly for annual wellness visit with primary care office  Here is a list of your preventive care/health maintenance measures and the plan for each if any are due:  PLAN For any measures below that may be due:   Health Maintenance  Topic Date Due   DTaP/Tdap/Td (1 - Tdap) Never done   Zoster Vaccines- Shingrix (1 of 2) Never done   DEXA SCAN  Never done   FOOT EXAM  06/25/2018   OPHTHALMOLOGY EXAM  10/15/2020   Diabetic kidney evaluation - Urine ACR  02/01/2021   COVID-19 Vaccine (4 - 2023-24 season) 06/19/2022   INFLUENZA VACCINE  05/20/2023   HEMOGLOBIN A1C  12/02/2023   Diabetic kidney evaluation - eGFR measurement  01/07/2024   Medicare Annual Wellness (AWV)  06/07/2024   Pneumonia Vaccine 37+ Years old  Completed   HPV VACCINES  Aged Out    -See a dentist at least yearly  -Get your eyes checked and then per your eye specialist's recommendations  -Other issues addressed today:   -I have included below further information regarding a healthy whole foods based diet, physical activity guidelines for adults, stress management and opportunities for social connections. I hope you find this information useful.   -----------------------------------------------------------------------------------------------------------------------------------------------------------------------------------------------------------------------------------------------------------  NUTRITION: -eat real food: lots of colorful vegetables (half the plate) and fruits -5-7 servings of vegetables and fruits per day (fresh or steamed is best), exp. 2 servings of vegetables with lunch and dinner and 2 servings of fruit per day. Berries and greens such as kale and collards are great choices.  -consume on a regular basis: whole grains (make sure first ingredient on label  contains  the word "whole"), fresh fruits, fish, nuts, seeds, healthy oils (such as olive oil, avocado oil, grape seed oil) -may eat small amounts of dairy and lean meat on occasion, but avoid processed meats such as ham, bacon, lunch meat, etc. -drink water -try to avoid fast food and pre-packaged foods, processed meat -most experts advise limiting sodium to < 2300mg  per day, should limit further is any chronic conditions such as high blood pressure, heart disease, diabetes, etc. The American Heart Association advised that < 1500mg  is is ideal -try to avoid foods that contain any ingredients with names you do not recognize  -try to avoid sugar/sweets (except for the natural sugar that occurs in fresh fruit) -try to avoid sweet drinks -try to avoid white rice, white bread, pasta (unless whole grain), white or yellow potatoes  EXERCISE GUIDELINES FOR ADULTS: -if you wish to increase your physical activity, do so gradually and with the approval of your doctor -STOP and seek medical care immediately if you have any chest pain, chest discomfort or trouble breathing when starting or increasing exercise  -move and stretch your body, legs, feet and arms when sitting for long periods -Physical activity guidelines for optimal health in adults: -least 150 minutes per week of aerobic exercise (can talk, but not sing) once approved by your doctor, 20-30 minutes of sustained activity or two 10 minute episodes of sustained activity every day.  -resistance training at least 2 days per week if approved by your doctor -balance exercises 3+ days per week:   Stand somewhere where you have something sturdy to hold onto if you lose balance.    1) lift up on toes, start with 5x per day and work up to 20x   2) stand and lift on leg straight out to the side so that foot is a few inches of the floor, start with 5x each side and work up to 20x each side   3) stand on one foot, start with 5 seconds each side and work up to  20 seconds on each side  If you need ideas or help with getting more active:  -Silver sneakers https://tools.silversneakers.com  -Walk with a Doc: http://www.duncan-williams.com/  -try to include resistance (weight lifting/strength building) and balance exercises twice per week: or the following link for ideas: http://castillo-powell.com/  BuyDucts.dk  STRESS MANAGEMENT: -can try meditating, or just sitting quietly with deep breathing while intentionally relaxing all parts of your body for 5 minutes daily -if you need further help with stress, anxiety or depression please follow up with your primary doctor or contact the wonderful folks at [facility]: [phone]  SOCIAL CONNECTIONS: -options in [place] if you wish to engage in more social and exercise related activities:  -Silver sneakers https://tools.silversneakers.com  -Walk with a Doc: http://www.duncan-williams.com/  -Check out the [facility] Active Adults 50+ section on the [place] of [employer] (hiking clubs, book clubs, cards and games, chess, exercise classes, aquatic classes and much more) - see the website for details: https://www.[place].gov/departments/parks-recreation/active-adults50  -YouTube has lots of exercise videos for different ages and abilities as well  -[facility] (a variety of indoor and outdoor inperson activities for adults). ([phone]) [phone]. [address].  -Virtual Online Classes (a variety of topics): see seniorplanet.org or call (367) 691-9194  -consider volunteering at a school, hospice center, church, senior center or elsewhere           Terressa Koyanagi, DO

## 2023-07-02 ENCOUNTER — Ambulatory Visit: Payer: Medicare PPO | Admitting: Family Medicine

## 2023-07-19 ENCOUNTER — Ambulatory Visit: Payer: Medicare PPO | Admitting: Family Medicine

## 2023-07-26 ENCOUNTER — Ambulatory Visit: Payer: Medicare PPO | Admitting: Family Medicine

## 2023-07-28 ENCOUNTER — Ambulatory Visit: Payer: Medicare PPO | Admitting: Family Medicine

## 2023-07-28 ENCOUNTER — Encounter: Payer: Self-pay | Admitting: Family Medicine

## 2023-07-28 VITALS — BP 150/70 | HR 74 | Temp 97.9°F | Ht 64.0 in | Wt 182.2 lb

## 2023-07-28 DIAGNOSIS — Z7984 Long term (current) use of oral hypoglycemic drugs: Secondary | ICD-10-CM | POA: Diagnosis not present

## 2023-07-28 DIAGNOSIS — I10 Essential (primary) hypertension: Secondary | ICD-10-CM | POA: Diagnosis not present

## 2023-07-28 DIAGNOSIS — E119 Type 2 diabetes mellitus without complications: Secondary | ICD-10-CM

## 2023-07-28 DIAGNOSIS — Z23 Encounter for immunization: Secondary | ICD-10-CM

## 2023-07-28 DIAGNOSIS — E785 Hyperlipidemia, unspecified: Secondary | ICD-10-CM

## 2023-07-28 LAB — COMPREHENSIVE METABOLIC PANEL
ALT: 16 U/L (ref 0–35)
AST: 17 U/L (ref 0–37)
Albumin: 4 g/dL (ref 3.5–5.2)
Alkaline Phosphatase: 79 U/L (ref 39–117)
BUN: 12 mg/dL (ref 6–23)
CO2: 30 meq/L (ref 19–32)
Calcium: 9.9 mg/dL (ref 8.4–10.5)
Chloride: 99 meq/L (ref 96–112)
Creatinine, Ser: 0.72 mg/dL (ref 0.40–1.20)
GFR: 76.08 mL/min (ref >60.00)
Glucose, Bld: 131 mg/dL — ABNORMAL HIGH (ref 70–99)
Potassium: 3.7 meq/L (ref 3.5–5.1)
Sodium: 138 meq/L (ref 135–145)
Total Bilirubin: 0.6 mg/dL (ref 0.2–1.2)
Total Protein: 7.1 g/dL (ref 6.0–8.3)

## 2023-07-28 LAB — LIPID PANEL
Cholesterol: 199 mg/dL (ref 0–200)
HDL: 66.4 mg/dL (ref >39.00)
LDL Cholesterol: 109 mg/dL — ABNORMAL HIGH (ref 0–99)
NonHDL: 132.11
Total CHOL/HDL Ratio: 3
Triglycerides: 114 mg/dL (ref 0.0–149.0)
VLDL: 22.8 mg/dL (ref 0.0–40.0)

## 2023-07-28 NOTE — Addendum Note (Signed)
Addended byVickii Chafe on: 07/28/2023 01:40 PM   Modules accepted: Orders

## 2023-07-28 NOTE — Patient Instructions (Signed)
HOLD the baby aspirin     Try to decrease the Gatorade (secondary to the sodium content)

## 2023-07-28 NOTE — Progress Notes (Signed)
Established Patient Office Visit  Subjective   Patient ID: [name], female    DOB: 09-07-1936  Age: 87 y.o. MRN: [id number]  Chief Complaint  Patient presents with   Medical Management of Chronic Issues    HPI   [name] is seen today for medical follow-up.  She has advanced dementia and is here with caregiver.  They do not express any specific concerns.  She did not take her blood pressure medications this morning as they were in a hurry and that may reflect her elevated blood pressure.  This is generally been fairly well-controlled.  She does need flu vaccine.  Caregiver states that she does not like to drink regular water and she has been consuming Gatorade several times daily.  We have advised a try to discontinue this because of the increased sodium intake.  She has type 2 diabetes which is treated with metformin.  Last A1c 7.4%.  They apparently have been giving her baby aspirin 1 daily.  No history of CVA or known coronary disease.  She has hyperlipidemia and treated with simvastatin but apparently was not taking her statin last time lipids were checked.  She takes amlodipine and HCTZ for hypertension.  Caregiver states she has been compliant with medications other than this morning with blood pressure medicine  She has had good appetite.  Had 1 episode of blood in stool last visit but none since then.  Her CBC was stable.  Weight is actually up a few pounds from last visit.  Caregivers doing a good job making sure she is not taking in excessive high glycemic foods  Past Medical History:  Diagnosis Date   DEGENERATIVE JOINT DISEASE 08/25/2010   HYPERLIPIDEMIA 03/08/2009   HYPERTENSION 03/08/2009   Obesity    PREDIABETES 08/23/2009   Renal calculi    Past Surgical History:  Procedure Laterality Date   TONSILLECTOMY      reports that she has never smoked. She has never used smokeless tobacco. She reports that she does not drink alcohol and does not use drugs. family  history includes Diabetes in her father. No Known Allergies  ROS  Review of systems unreliable secondary to patient's dementia.   Objective:     BP (!) 150/70 (BP Location: Left Arm, Patient Position: Sitting, Cuff Size: Normal)   Pulse 74   Temp 97.9 F (36.6 C) (Oral)   Ht 5\' 4"  (1.626 m)   Wt 182 lb 3.2 oz (82.6 kg)   SpO2 93%   BMI 31.27 kg/m  BP Readings from Last 3 Encounters:  07/28/23 (!) 150/70  06/03/23 (!) 142/70  01/19/23 138/72   Wt Readings from Last 3 Encounters:  07/28/23 182 lb 3.2 oz (82.6 kg)  06/08/23 177 lb (80.3 kg)  06/01/23 177 lb 6.4 oz (80.5 kg)      Physical Exam Vitals reviewed.  Constitutional:      Appearance: She is well-developed.  Eyes:     Pupils: Pupils are equal, round, and reactive to light.  Neck:     Thyroid: No thyromegaly.     Vascular: No JVD.  Cardiovascular:     Rate and Rhythm: Normal rate and regular rhythm.     Heart sounds: Murmur heard.     No gallop.     Comments: She has 3/6 systolic murmur right upper sternal border and left sternal border. Pulmonary:     Effort: Pulmonary effort is normal. No respiratory distress.     Breath sounds: Normal breath  sounds. No wheezing or rales.  Musculoskeletal:     Cervical back: Neck supple.     Right lower leg: No edema.     Left lower leg: No edema.  Neurological:     Mental Status: She is alert.      No results found for any visits on 07/28/23.  Last CBC Lab Results  Component Value Date   WBC 6.3 06/01/2023   HGB 14.3 06/01/2023   HCT 44.4 06/01/2023   MCV 85.6 06/01/2023   MCH 26.8 01/07/2023   RDW 14.3 06/01/2023   PLT 425.0 (H) 06/01/2023   Last metabolic panel Lab Results  Component Value Date   GLUCOSE 146 (H) 01/07/2023   NA 137 01/07/2023   K 3.5 01/07/2023   CL 98 01/07/2023   CO2 26 01/07/2023   BUN 20 01/07/2023   CREATININE 0.80 01/07/2023   GFRNONAA >60 01/07/2023   CALCIUM 10.0 01/07/2023   PROT 8.5 (H) 01/07/2023   ALBUMIN 4.5  01/07/2023   BILITOT 1.1 01/07/2023   ALKPHOS 86 01/07/2023   AST 29 01/07/2023   ALT 18 01/07/2023   ANIONGAP 13 01/07/2023   Last lipids Lab Results  Component Value Date   CHOL 240 (H) 12/30/2022   HDL 53.70 12/30/2022   LDLCALC 157 (H) 12/30/2022   LDLDIRECT 126.0 09/01/2012   TRIG 147.0 12/30/2022   CHOLHDL 4 12/30/2022   Last hemoglobin A1c Lab Results  Component Value Date   HGBA1C 7.4 (H) 06/01/2023      The ASCVD Risk score (Arnett DK, et al., 2019) failed to calculate for the following reasons:   The 2019 ASCVD risk score is only valid for ages 18 to 54    Assessment & Plan:   #1 type 2 diabetes.  Fair control by recent A1c 7.4%.  Continue to avoid regular consumption of high glycemic foods.  Recommend they try to curtail her consumption of regular Gatorade both because of the sugar and sodium content.  Recheck A1c at 22-month follow-up  #2 hypertension.  Generally well-controlled but up some today.  Did not take her medications on her usual time this morning.  Continue to monitor at home regularly and be in touch if consistently greater than 130/80.  Continue HCTZ and amlodipine.  Scale back Gatorade use to reduce her sodium intake.  #3 she has fairly prominent heart murmurs over the aortic and mitral valve region.  She is not having any reported dyspnea or other concerns.  Very sedentary.  Will try to discuss with her brother Dorene Sorrow) who is power of attorney but given her advanced dementia would not likely recommend pursuing any further aggressive intervention or evaluation as long as she is asymptomatic  Recommend she consider flu vaccine and she consents along with caregiver.  This was given today.  We did recommend she consider discontinuation of aspirin given her age of 13 and no history of cerebrovascular disease or known CAD   Return in about 4 months (around 11/28/2023).    Evelena Peat, MD

## 2023-09-27 ENCOUNTER — Other Ambulatory Visit: Payer: Self-pay | Admitting: Family Medicine

## 2023-11-09 DIAGNOSIS — M19032 Primary osteoarthritis, left wrist: Secondary | ICD-10-CM | POA: Diagnosis not present

## 2023-11-09 DIAGNOSIS — M19031 Primary osteoarthritis, right wrist: Secondary | ICD-10-CM | POA: Diagnosis not present

## 2023-11-09 DIAGNOSIS — M25532 Pain in left wrist: Secondary | ICD-10-CM | POA: Diagnosis not present

## 2023-11-09 DIAGNOSIS — M25531 Pain in right wrist: Secondary | ICD-10-CM | POA: Diagnosis not present

## 2023-12-24 ENCOUNTER — Other Ambulatory Visit: Payer: Self-pay | Admitting: Family Medicine

## 2024-01-01 ENCOUNTER — Other Ambulatory Visit: Payer: Self-pay | Admitting: Family Medicine

## 2024-02-16 ENCOUNTER — Ambulatory Visit: Admitting: Family Medicine

## 2024-02-16 VITALS — BP 132/70 | HR 75 | Temp 98.4°F | Wt 181.6 lb

## 2024-02-16 DIAGNOSIS — E119 Type 2 diabetes mellitus without complications: Secondary | ICD-10-CM

## 2024-02-16 DIAGNOSIS — I10 Essential (primary) hypertension: Secondary | ICD-10-CM | POA: Diagnosis not present

## 2024-02-16 DIAGNOSIS — Z7984 Long term (current) use of oral hypoglycemic drugs: Secondary | ICD-10-CM

## 2024-02-16 DIAGNOSIS — R011 Cardiac murmur, unspecified: Secondary | ICD-10-CM | POA: Diagnosis not present

## 2024-02-16 DIAGNOSIS — E785 Hyperlipidemia, unspecified: Secondary | ICD-10-CM

## 2024-02-16 LAB — POCT GLYCOSYLATED HEMOGLOBIN (HGB A1C): Hemoglobin A1C: 6.9 % — AB (ref 4.0–5.6)

## 2024-02-16 NOTE — Patient Instructions (Addendum)
 A1C today improved to 6.9%.    Let me know if BP consistently > 140/90.    Set up 6 month follow up.

## 2024-02-16 NOTE — Progress Notes (Signed)
 Established Patient Office Visit  Subjective   Patient ID: [name], female    DOB: 03/02/1936  Age: 88 y.o. MRN: [id number]  No chief complaint on file.   HPI   [name] is seen for medical follow-up.  She is brought in by caregiver.  She has fairly advanced dementia and most of history provided from caregiver.  She has history of hypertension, type 2 diabetes, dementia, osteoarthritis, systolic heart murmur, hyperlipidemia.  Generally doing well.  Weight unchanged from last visit.  Medications reviewed and include simvastatin , amlodipine , HCTZ, donepezil , metformin .  Compliant with medications.  No recent dyspnea or chest pain.  No falls since last visit.  She had full set of labs last October.  Kidney function and electrolytes stable.  Past Medical History:  Diagnosis Date   DEGENERATIVE JOINT DISEASE 08/25/2010   HYPERLIPIDEMIA 03/08/2009   HYPERTENSION 03/08/2009   Obesity    PREDIABETES 08/23/2009   Renal calculi    Past Surgical History:  Procedure Laterality Date   TONSILLECTOMY      reports that she has never smoked. She has never used smokeless tobacco. She reports that she does not drink alcohol and does not use drugs. family history includes Diabetes in her father. No Known Allergies  Review of Systems  Constitutional:  Negative for chills, fever and weight loss.  Eyes:  Negative for blurred vision.  Respiratory:  Negative for shortness of breath.   Cardiovascular:  Negative for chest pain.  Neurological:  Negative for dizziness, weakness and headaches.      Objective:     BP 132/70 (BP Location: Left Arm, Cuff Size: Normal)   Pulse 75   Temp 98.4 F (36.9 C) (Oral)   Wt 181 lb 9.6 oz (82.4 kg)   SpO2 (!) 75%   BMI 31.17 kg/m  BP Readings from Last 3 Encounters:  02/16/24 132/70  07/28/23 (!) 150/70  06/03/23 (!) 142/70   Wt Readings from Last 3 Encounters:  02/16/24 181 lb 9.6 oz (82.4 kg)  07/28/23 182 lb 3.2 oz (82.6 kg)  06/08/23 177  lb (80.3 kg)      Physical Exam Vitals reviewed.  Constitutional:      Appearance: She is well-developed.  Eyes:     Pupils: Pupils are equal, round, and reactive to light.  Neck:     Thyroid : No thyromegaly.     Vascular: No JVD.  Cardiovascular:     Rate and Rhythm: Normal rate and regular rhythm.     Heart sounds: Murmur heard.     No gallop.  Pulmonary:     Effort: Pulmonary effort is normal. No respiratory distress.     Breath sounds: Normal breath sounds. No wheezing or rales.  Musculoskeletal:     Cervical back: Neck supple.     Right lower leg: No edema.     Left lower leg: No edema.  Neurological:     Mental Status: She is alert.      Results for orders placed or performed in visit on 02/16/24  POC HgB A1c  Result Value Ref Range   Hemoglobin A1C 6.9 (A) 4.0 - 5.6 %   HbA1c POC (<> result, manual entry)     HbA1c, POC (prediabetic range)     HbA1c, POC (controlled diabetic range)      Last CBC Lab Results  Component Value Date   WBC 6.3 06/01/2023   HGB 14.3 06/01/2023   HCT 44.4 06/01/2023   MCV 85.6 06/01/2023  MCH 26.8 01/07/2023   RDW 14.3 06/01/2023   PLT 425.0 (H) 06/01/2023   Last metabolic panel Lab Results  Component Value Date   GLUCOSE 131 (H) 07/28/2023   NA 138 07/28/2023   K 3.7 07/28/2023   CL 99 07/28/2023   CO2 30 07/28/2023   BUN 12 07/28/2023   CREATININE 0.72 07/28/2023   GFR 76.08 07/28/2023   CALCIUM 9.9 07/28/2023   PROT 7.1 07/28/2023   ALBUMIN 4.0 07/28/2023   BILITOT 0.6 07/28/2023   ALKPHOS 79 07/28/2023   AST 17 07/28/2023   ALT 16 07/28/2023   ANIONGAP 13 01/07/2023   Last lipids Lab Results  Component Value Date   CHOL 199 07/28/2023   HDL 66.40 07/28/2023   LDLCALC 109 (H) 07/28/2023   LDLDIRECT 126.0 09/01/2012   TRIG 114.0 07/28/2023   CHOLHDL 3 07/28/2023   Last hemoglobin A1c Lab Results  Component Value Date   HGBA1C 6.9 (A) 02/16/2024      The ASCVD Risk score (Arnett DK, et al.,  2019) failed to calculate for the following reasons:   The 2019 ASCVD risk score is only valid for ages 45 to 30    Assessment & Plan:   #1 type 2 diabetes improved with A1c 6.9%.  Continue metformin .  Reassess in 6 months.  Caregivers are overseeing her diet and have scaled back her starches somewhat which seem to be paying off.  She has had steady improvement in her last few A1c's  #2 hypertension.  Initial reading was up but did come down significantly after rest.  Continue amlodipine  and HCTZ.  Check electrolytes at follow-up in 6 months  #3 hyperlipidemia treated with simvastatin  40 mg daily.  Recheck lipid and CMP at follow-up  #4 heart murmur-3/6 systolic murmur heard over aortic valve and also to a lesser extent left sternal border.  We discussed that we could evaluate this further with echocardiogram but given her advanced dementia and other comorbidities since she is asymptomatic probably not worth pursuing at this time and family agree.   Return in about 6 months (around 08/17/2024).    Joyce Lamy, MD

## 2024-03-15 ENCOUNTER — Telehealth: Payer: Self-pay

## 2024-03-15 NOTE — Telephone Encounter (Signed)
 Copied from CRM [id number]. Topic: General - Other >> Mar 15, 2024  9:53 AM Freya Jesus wrote: Reason for CRM: [name] patient caregiver called - stated patient slowed down on her eating.. Started last week and only eating one meal a day. Started her on a stool softener in case she was constipated. Per her judgement she has not had a bowl movement. Caregiver is wanting to know if she should bring patient in or is this normal with Alzheimer's? - Callback: [phone].

## 2024-03-15 NOTE — Telephone Encounter (Signed)
 [name] informed of the message below and voiced understanding

## 2024-03-24 ENCOUNTER — Other Ambulatory Visit: Payer: Self-pay | Admitting: Family Medicine

## 2024-06-29 ENCOUNTER — Other Ambulatory Visit: Payer: Self-pay | Admitting: Family Medicine

## 2024-07-11 ENCOUNTER — Encounter (INDEPENDENT_AMBULATORY_CARE_PROVIDER_SITE_OTHER): Admitting: Family Medicine

## 2024-07-11 NOTE — Progress Notes (Signed)
 error

## 2024-08-18 ENCOUNTER — Ambulatory Visit: Admitting: Family Medicine

## 2024-08-23 ENCOUNTER — Ambulatory Visit: Payer: Self-pay | Admitting: Family Medicine

## 2024-08-23 ENCOUNTER — Encounter: Payer: Self-pay | Admitting: Family Medicine

## 2024-08-23 ENCOUNTER — Ambulatory Visit: Admitting: Family Medicine

## 2024-08-23 VITALS — BP 124/64 | HR 78 | Temp 98.3°F | Wt 186.7 lb

## 2024-08-23 DIAGNOSIS — E785 Hyperlipidemia, unspecified: Secondary | ICD-10-CM

## 2024-08-23 DIAGNOSIS — I10 Essential (primary) hypertension: Secondary | ICD-10-CM

## 2024-08-23 DIAGNOSIS — E119 Type 2 diabetes mellitus without complications: Secondary | ICD-10-CM | POA: Diagnosis not present

## 2024-08-23 DIAGNOSIS — Z23 Encounter for immunization: Secondary | ICD-10-CM

## 2024-08-23 LAB — COMPREHENSIVE METABOLIC PANEL WITH GFR
ALT: 14 U/L (ref 0–35)
AST: 19 U/L (ref 0–37)
Albumin: 3.9 g/dL (ref 3.5–5.2)
Alkaline Phosphatase: 77 U/L (ref 39–117)
BUN: 12 mg/dL (ref 6–23)
CO2: 30 meq/L (ref 19–32)
Calcium: 9.7 mg/dL (ref 8.4–10.5)
Chloride: 100 meq/L (ref 96–112)
Creatinine, Ser: 0.71 mg/dL (ref 0.40–1.20)
GFR: 76.79 mL/min (ref >60.00)
Glucose, Bld: 140 mg/dL — ABNORMAL HIGH (ref 70–99)
Potassium: 4 meq/L (ref 3.5–5.1)
Sodium: 137 meq/L (ref 135–145)
Total Bilirubin: 0.6 mg/dL (ref 0.2–1.2)
Total Protein: 7.4 g/dL (ref 6.0–8.3)

## 2024-08-23 LAB — LIPID PANEL
Cholesterol: 172 mg/dL (ref 0–200)
HDL: 52.4 mg/dL (ref >39.00)
LDL Cholesterol: 91 mg/dL (ref 0–99)
NonHDL: 119.29
Total CHOL/HDL Ratio: 3
Triglycerides: 141 mg/dL (ref 0.0–149.0)
VLDL: 28.2 mg/dL (ref 0.0–40.0)

## 2024-08-23 LAB — POCT GLYCOSYLATED HEMOGLOBIN (HGB A1C): Hemoglobin A1C: 7.4 % — AB (ref 4.0–5.6)

## 2024-08-23 NOTE — Progress Notes (Signed)
 Established Patient Office Visit  Subjective   Patient ID: [name], female    DOB: [birth year]  Age: 88 y.o. MRN: [id number]  Chief Complaint  Patient presents with   Medical Management of Chronic Issues    HPI   [name] is seen today companied by her caregiver.  She has history of dementia, type 2 diabetes, hypertension, osteoarthritis, hyperlipidemia, systolic murmur over aortic valve predominantly.  Generally doing well.  Good appetite.  No recent falls.  She has to have medication oversight secondary to her dementia.  She is on simvastatin , metformin , HCTZ, Aricept , amlodipine .  No known problems with medications.  Blood sugars not monitored regularly.  Last A1c 6.9%.  Past Medical History:  Diagnosis Date   DEGENERATIVE JOINT DISEASE 08/25/2010   HYPERLIPIDEMIA 03/08/2009   HYPERTENSION 03/08/2009   Obesity    PREDIABETES 08/23/2009   Renal calculi    Past Surgical History:  Procedure Laterality Date   TONSILLECTOMY      reports that she has never smoked. She has never used smokeless tobacco. She reports that she does not drink alcohol and does not use drugs. family history includes Diabetes in her father. No Known Allergies  Review of Systems  Constitutional:  Negative for chills and fever.  Respiratory:  Negative for shortness of breath.   Cardiovascular:  Negative for chest pain.  Gastrointestinal:  Negative for abdominal pain.  Genitourinary:  Negative for dysuria.      Objective:     BP 124/64 (BP Location: Left Arm, Cuff Size: Normal)   Pulse 78   Temp 98.3 F (36.8 C) (Oral)   Wt 186 lb 11.2 oz (84.7 kg)   SpO2 95%   BMI 32.05 kg/m  BP Readings from Last 3 Encounters:  08/23/24 124/64  02/16/24 132/70  07/28/23 (!) 150/70   Wt Readings from Last 3 Encounters:  08/23/24 186 lb 11.2 oz (84.7 kg)  02/16/24 181 lb 9.6 oz (82.4 kg)  07/28/23 182 lb 3.2 oz (82.6 kg)      Physical Exam Vitals reviewed.  Constitutional:      General:  She is not in acute distress.    Appearance: She is not ill-appearing.  Cardiovascular:     Rate and Rhythm: Normal rate and regular rhythm.     Heart sounds: Murmur heard.     Comments: 3/6 systolic ejection murmur right upper sternal border Musculoskeletal:     Right lower leg: No edema.     Left lower leg: No edema.  Skin:    Comments: Feet reveal no skin lesions. Good distal foot pulses. Good capillary refill. No calluses. Normal sensation   Neurological:     Mental Status: She is alert.      Results for orders placed or performed in visit on 08/23/24  POC HgB A1c  Result Value Ref Range   Hemoglobin A1C 7.4 (A) 4.0 - 5.6 %   HbA1c POC (<> result, manual entry)     HbA1c, POC (prediabetic range)     HbA1c, POC (controlled diabetic range)        The ASCVD Risk score (Arnett DK, et al., 2019) failed to calculate for the following reasons:   The 2019 ASCVD risk score is only valid for ages 14 to 34    Assessment & Plan:   Problem List Items Addressed This Visit       Unprioritized   Hyperlipidemia   Relevant Orders   Lipid panel   Essential hypertension  Relevant Orders   CMP   Other Visit Diagnoses       Controlled type 2 diabetes mellitus without complication, without long-term current use of insulin (HCC)    -  Primary   Relevant Orders   POC HgB A1c (Completed)     Need for influenza vaccination       Relevant Orders   Flu vaccine HIGH DOSE PF(Fluzone Trivalent) (Completed)     88 year old female with advanced dementia and other medical problems as above which are stable.  Initial blood pressure up which came down substantially with rest.  Continue current medications.  Check labs today with lipid and CMP.  A1c 7.4% and given her age of 57 with other comorbidities including dementia feel like goal of 7-8 is reasonable for her diabetes control.  Flu vaccine is given today  Return in about 6 months (around 02/20/2025).    Joyce Scarlet, MD

## 2024-08-30 ENCOUNTER — Telehealth: Payer: Self-pay | Admitting: Family Medicine

## 2024-08-30 NOTE — Telephone Encounter (Signed)
 Copied from CRM [id number]. Topic: General - Other >> Aug 29, 2024  4:29 PM Dedra B wrote: Reason for CRM: Pt rep returning call regarding scheduling an AWV. She said pt saw Dr. Micheal last week and completed an AWV. Tried to explain that an AWV is different from a regular office visit.

## 2024-09-23 ENCOUNTER — Other Ambulatory Visit: Payer: Self-pay | Admitting: Family Medicine

## 2024-09-28 NOTE — Telephone Encounter (Signed)
 09/28/24  l/m asking pt to call

## 2024-10-05 ENCOUNTER — Ambulatory Visit: Payer: Self-pay

## 2024-10-05 NOTE — Telephone Encounter (Signed)
 FYI Only or Action Required?: FYI only for provider: appointment scheduled on 10/10/24.  Patient was last seen in primary care on 08/23/2024 by Micheal Wolm ORN, MD.  Called Nurse Triage reporting Epistaxis.  Symptoms began yesterday.  Interventions attempted: Nothing.  Symptoms are: completely resolved.  Triage Disposition: See PCP Within 2 Weeks (overriding Home Care)  Patient/caregiver understands and will follow disposition?: Yes Copied from CRM [id number]. Topic: Clinical - Red Word Triage >> Oct 05, 2024 10:11 AM Jayma L wrote: Red Word that prompted transfer to Nurse Triage: patient is 78, had 3 nose bleeds this past week and care giver said she just had one and it was the most blood shes seen said the bleeding stopped. Reason for Disposition  [1] Mild-moderate nosebleed AND [2] bleeding stopped now  Answer Assessment - Initial Assessment Questions Additional info: Caregiver Glenise calling to schedule appointment to assess recurring nosebleed. Aayana has had 3 nose bleed in one week. Caregiver Glenise arrived at [facility] home this morning and noted she had nosebleed overnight, bleeding has already subsided. Lile feels well, she has no other symptoms, no injuries, denies blood thinners. She does blow her nose  a lot but denies any congestion or discharge. Scheduled follow up appointment with pcp on 10/10/24. They will call back if she develops another nose bleed or if she develops and other symptoms.     1. AMOUNT OF BLEEDING: How bad is the bleeding? How much blood was lost? Has the bleeding stopped?     Bleeding stopped  2. ONSET: When did the nosebleed start?      Overnight  3. FREQUENCY: How many nosebleeds have you had in the last 24 hours?      1 time in 24 hours 4. RECURRENT SYMPTOMS: Have there been other recent nosebleeds? If Yes, ask: How long did it take you to stop the bleeding? What worked best?      3 times in week  5. CAUSE: What do you think  caused this nosebleed?     unknown 6. LOCAL FACTORS: Do you have any cold symptoms?, Have you been rubbing or picking at your nose?     Blows nose a lot but denies symptoms 7. SYSTEMIC FACTORS: Do you have high blood pressure or any bleeding problems?    hbp 8. BLOOD THINNERS: Do you take any blood thinners? (e.g., aspirin, clopidogrel / Plavix, coumadin, heparin). Notes: Other strong blood thinners include: Arixtra (fondaparinux), Eliquis (apixaban), Pradaxa (dabigatran), and Xarelto (rivaroxaban).     denies 9. OTHER SYMPTOMS: Do you have any other symptoms? (e.g., lightheadedness)     Denies all other symptoms  10. PREGNANCY: Is there any chance you are pregnant? When was your last menstrual period?  Protocols used: Nosebleed-A-AH

## 2024-10-10 ENCOUNTER — Encounter: Payer: Self-pay | Admitting: Family Medicine

## 2024-10-10 ENCOUNTER — Ambulatory Visit: Admitting: Family Medicine

## 2024-10-10 VITALS — BP 144/62 | HR 85 | Temp 98.4°F | Wt 185.3 lb

## 2024-10-10 DIAGNOSIS — R040 Epistaxis: Secondary | ICD-10-CM | POA: Diagnosis not present

## 2024-10-10 NOTE — Progress Notes (Signed)
 "  Established Patient Office Visit  Subjective   Patient ID: [name], female    DOB: [birth year]  Age: 88 y.o. MRN: [id number]  Chief Complaint  Patient presents with   Epistaxis    HPI   [name] has dementia and is here accompanied by caregiver.  Caregiver relates that they noticed blood on her pillow sheets a few days ago couple mornings in a row but they have not noticed any the past 2 days.  Did not see any active nosebleed.  Denies any nasal congestion symptoms.  She has some mild chronic cough but unchanged.  No recent hemoptysis.  No known trauma.  Caregiver suspected probably nosebleed.  She was taken off aspirin recently.  Does not take any anticoagulants.  Past Medical History:  Diagnosis Date   DEGENERATIVE JOINT DISEASE 08/25/2010   HYPERLIPIDEMIA 03/08/2009   HYPERTENSION 03/08/2009   Obesity    PREDIABETES 08/23/2009   Renal calculi    Past Surgical History:  Procedure Laterality Date   TONSILLECTOMY      reports that she has never smoked. She has never used smokeless tobacco. She reports that she does not drink alcohol and does not use drugs. family history includes Diabetes in her father. Allergies[1]  Review of Systems  Constitutional:  Negative for chills and fever.  HENT:  Positive for nosebleeds. Negative for sinus pain.       Objective:     BP (!) 144/62   Pulse 85   Temp 98.4 F (36.9 C) (Oral)   Wt 185 lb 4.8 oz (84.1 kg)   SpO2 94%   BMI 31.81 kg/m  BP Readings from Last 3 Encounters:  10/10/24 (!) 144/62  08/23/24 124/64  02/16/24 132/70   Wt Readings from Last 3 Encounters:  10/10/24 185 lb 4.8 oz (84.1 kg)  08/23/24 186 lb 11.2 oz (84.7 kg)  02/16/24 181 lb 9.6 oz (82.4 kg)      Physical Exam Vitals reviewed.  Constitutional:      General: She is not in acute distress.    Appearance: She is not ill-appearing.  HENT:     Right Ear: Tympanic membrane normal.     Left Ear: Tympanic membrane normal.     Nose:      Comments: Right naris is clear.  Left naris reveals some dilated vessels left anterior nasal septum.  No active bleeding.  No blood clots noted.  No ulcers or lesions noted    Mouth/Throat:     Mouth: Mucous membranes are moist.     Pharynx: Oropharynx is clear.  Cardiovascular:     Rate and Rhythm: Normal rate.     Heart sounds: Murmur heard.     Comments: Systolic ejection murmur over aortic valve as previously noted     No results found for any visits on 10/10/24.    The ASCVD Risk score (Arnett DK, et al., 2019) failed to calculate for the following reasons:   The 2019 ASCVD risk score is only valid for ages 61 to 42   * - Cholesterol units were assumed    Assessment & Plan:   Probable epistaxis recently.  They have not noted any nosebleed past couple days.  We offered to cauterize area of erythema left naris but she does not have any active bleeding.  We prefer to avoid at this time.  We have recommended nasal saline couple times daily and consider small amount of Vaseline to help prevent drying.   Joyce Scarlet,  MD     [1] No Known Allergies  "

## 2024-10-10 NOTE — Patient Instructions (Signed)
 Consider over the counter saline nose spray twice daily  Also consider small amount of vaseline to left naris once or twice daily.

## 2025-02-20 ENCOUNTER — Ambulatory Visit: Admitting: Family Medicine
